# Patient Record
Sex: Male | Born: 1975
Health system: Southern US, Community
[De-identification: ages and names within clinical notes are randomized; demographics above are authoritative.]

## PROBLEM LIST (undated history)

## (undated) DIAGNOSIS — E878 Other disorders of electrolyte and fluid balance, not elsewhere classified: Secondary | ICD-10-CM

## (undated) DIAGNOSIS — Z86010 Personal history of colonic polyps: Secondary | ICD-10-CM

## (undated) DIAGNOSIS — N401 Enlarged prostate with lower urinary tract symptoms: Secondary | ICD-10-CM

## (undated) DIAGNOSIS — R740 Nonspecific elevation of levels of transaminase and lactic acid dehydrogenase [LDH]: Secondary | ICD-10-CM

## (undated) DIAGNOSIS — R7303 Prediabetes: Secondary | ICD-10-CM

## (undated) DIAGNOSIS — L0201 Cutaneous abscess of face: Secondary | ICD-10-CM

## (undated) DIAGNOSIS — J301 Allergic rhinitis due to pollen: Secondary | ICD-10-CM

## (undated) DIAGNOSIS — F411 Generalized anxiety disorder: Secondary | ICD-10-CM

## (undated) DIAGNOSIS — K760 Fatty (change of) liver, not elsewhere classified: Secondary | ICD-10-CM

## (undated) DIAGNOSIS — R030 Elevated blood-pressure reading, without diagnosis of hypertension: Secondary | ICD-10-CM

## (undated) DIAGNOSIS — N138 Other obstructive and reflux uropathy: Secondary | ICD-10-CM

## (undated) DIAGNOSIS — Z860101 Personal history of adenomatous and serrated colon polyps: Secondary | ICD-10-CM

## (undated) HISTORY — DX: Other disorders of electrolyte and fluid balance, not elsewhere classified: E87.8

## (undated) HISTORY — DX: Personal history of colonic polyps: Z86.010

## (undated) HISTORY — DX: Allergic rhinitis due to pollen: J30.1

## (undated) HISTORY — DX: Generalized anxiety disorder: F41.1

## (undated) HISTORY — DX: Cutaneous abscess of face: L02.01

## (undated) HISTORY — DX: Prediabetes: R73.03

## (undated) HISTORY — DX: Fatty (change of) liver, not elsewhere classified: K76.0

## (undated) HISTORY — DX: Nonspecific elevation of levels of transaminase and lactic acid dehydrogenase (ldh): R74.0

## (undated) HISTORY — DX: Personal history of adenomatous and serrated colon polyps: Z86.0101

## (undated) HISTORY — PX: COLONOSCOPY W/ POLYPECTOMY: SHX1380

## (undated) HISTORY — DX: Elevated blood-pressure reading, without diagnosis of hypertension: R03.0

## (undated) HISTORY — DX: Benign prostatic hyperplasia with lower urinary tract symptoms: N40.1

## (undated) HISTORY — DX: Other obstructive and reflux uropathy: N13.8

---

## 2016-02-16 DIAGNOSIS — N401 Enlarged prostate with lower urinary tract symptoms: Secondary | ICD-10-CM

## 2016-02-16 DIAGNOSIS — N138 Other obstructive and reflux uropathy: Secondary | ICD-10-CM

## 2016-02-16 HISTORY — DX: Benign prostatic hyperplasia with lower urinary tract symptoms: N40.1

## 2016-02-16 HISTORY — DX: Other obstructive and reflux uropathy: N13.8

## 2016-05-26 ENCOUNTER — Ambulatory Visit (INDEPENDENT_AMBULATORY_CARE_PROVIDER_SITE_OTHER): Payer: BLUE CROSS/BLUE SHIELD | Admitting: Family Medicine

## 2016-05-26 ENCOUNTER — Encounter: Payer: Self-pay | Admitting: Family Medicine

## 2016-05-26 VITALS — BP 127/81 | HR 76 | Temp 97.7°F | Resp 16 | Ht 71.0 in | Wt 221.8 lb

## 2016-05-26 DIAGNOSIS — N410 Acute prostatitis: Secondary | ICD-10-CM | POA: Diagnosis not present

## 2016-05-26 DIAGNOSIS — R350 Frequency of micturition: Secondary | ICD-10-CM | POA: Diagnosis not present

## 2016-05-26 LAB — POCT URINALYSIS DIPSTICK
Bilirubin, UA: NEGATIVE
GLUCOSE UA: NEGATIVE
KETONES UA: NEGATIVE
Leukocytes, UA: NEGATIVE
NITRITE UA: NEGATIVE
PH UA: 7 (ref 5.0–8.0)
Protein, UA: NEGATIVE
RBC UA: NEGATIVE
Spec Grav, UA: 1.02 (ref 1.010–1.025)
UROBILINOGEN UA: 0.2 U/dL

## 2016-05-26 MED ORDER — LEVOFLOXACIN 500 MG PO TABS: ORAL_TABLET | ORAL | 0 refills | Status: DC

## 2016-05-26 NOTE — Progress Notes (Signed)
Pre visit review using our clinic review tool, if applicable. No additional management support is needed unless otherwise documented below in the visit note. 

## 2016-05-26 NOTE — Progress Notes (Addendum)
Office Note 05/26/2016  CC:  Chief Complaint  Patient presents with  . Establish Care  . Urinary Frequency    x 1-2 months    HPI:  Joshua Page is a 41 y.o. male who is new to the practice but has not been here yet for a visit to officially establish care. I agreed to see him for an acute problem: urinary complaints.   Patient's most recent primary MD: Dr. Roosvelt Maser in [place]--pt moved to [place] 2 yrs ago. Old records were not reviewed prior to or during today's visit.  About the last 2-3 months he has had to urinate q 2-3 hours.  No sensation of incomplete emptying. Says urine stream is not as strong as it used to be.  Occ hurts in penis with urination.  No d/c but dribbling is noted "all the time".  Nocturia 1-2 times per night.  Doesn't limit fluids in evenings any.  Occ hesitancy before urine will come out.  No hx of signif retention.  No hx of STI.  He is monogamous with his wife.  No blood in urine. Denies irritation or pain in prostate region but occ has some pain in L groin area. No new habits that preceded onset of sx's.  TAkes allegra D prn.  No polydipsia.  Past Medical History:  Diagnosis Date  . Elevated blood pressure reading without diagnosis of hypertension   . Hay fever   . Prediabetes    via work labs 2017/18    History reviewed. No pertinent surgical history.  Family History  Problem Relation Age of Onset  . Mental illness Father   . Diabetes Father   . Alcohol abuse Father   . Diabetes Maternal Grandmother   . Pancreatic cancer Maternal Grandfather     Social History   Social History  . Marital status: Married    Spouse name: N/A  . Number of children: N/A  . Years of education: N/A   Occupational History  . Not on file.   Social History Main Topics  . Smoking status: Former Smoker    Packs/day: 0.25    Years: 15.00    Types: Cigarettes    Quit date: 02/15/2013  . Smokeless tobacco: Former Neurosurgeon    Types: Chew    Quit date: 02/15/2014   . Alcohol use No  . Drug use: No  . Sexual activity: Not on file   Other Topics Concern  . Not on file   Social History Narrative   Married, 5 children.     Educ: college Surveyor, quantity)   [employer]	[place] in past.   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   No tobacco as of 2016.  5 pack-yr hx prior.  Hx of dipping snuff as well.      MEDS: none  No Known Allergies  ROS Review of Systems  Constitutional: Negative for fatigue and fever.  HENT: Negative for congestion and sore throat.   Eyes: Negative for visual disturbance.  Respiratory: Negative for cough.   Cardiovascular: Negative for chest pain.  Gastrointestinal: Negative for abdominal pain and nausea.  Genitourinary: Negative for scrotal swelling and testicular pain.       See HPI  Musculoskeletal: Negative for back pain and joint swelling.  Skin: Negative for rash.  Neurological: Negative for weakness and headaches.  Hematological: Negative for adenopathy.    PE; Blood pressure 127/81, pulse 76, temperature 97.7 F (36.5 C), temperature source Oral, resp. rate 16, height  (  1.803 m), weight 221 lb 12 oz (100.6 kg), SpO2 96 %. Gen: Alert, well appearing.  Patient is oriented to person, place, time, and situation. AFFECT: pleasant, lucid thought and speech. ZOX:WRUE: no injection, icteris, swelling, or exudate.  EOMI, PERRLA. Mouth: lips without lesion/swelling.  Oral mucosa pink and moist. Oropharynx without erythema, exudate, or swelling.  CV: RRR, no m/r/g.   LUNGS: CTA bilat, nonlabored resps, good aeration in all lung fields. ABD: soft, NT/ND.  When suprapubic region is palpated deeply it makes pt want to urinate worse.  No tenderness. EXT: no clubbing, cyanosis, or edema.  Rectal exam: negative without mass, lesions or tenderness, PROSTATE EXAM: smooth and symmetric without nodules.  Prostate enlarged 2+, symmetrical..  Palpation of prostate shows generalized hypersensitivity and urge to  urinate, but no definite tenderness.    Pertinent labs:   CC UA today: entirely normal  ASSESSMENT AND PLAN:   Acute prostatitis suspected. Treat with levaquin 500 mg qd x 14d. Avoid caffeine. Sent urine for c/s for completeness.  An After Visit Summary was printed and given to the patient.  Return for 2-3 weeks for fasting CPE and f/u prostate.  Signed:  Santiago Bumpers, MD           05/26/2016

## 2016-05-27 LAB — URINE CULTURE: Organism ID, Bacteria: NO GROWTH

## 2016-05-28 ENCOUNTER — Ambulatory Visit: Payer: Self-pay | Admitting: Family Medicine

## 2016-06-11 ENCOUNTER — Other Ambulatory Visit: Payer: Self-pay | Admitting: *Deleted

## 2016-06-11 ENCOUNTER — Encounter: Payer: BLUE CROSS/BLUE SHIELD | Admitting: Family Medicine

## 2016-06-11 ENCOUNTER — Other Ambulatory Visit (INDEPENDENT_AMBULATORY_CARE_PROVIDER_SITE_OTHER): Payer: BLUE CROSS/BLUE SHIELD

## 2016-06-11 ENCOUNTER — Encounter: Payer: Self-pay | Admitting: *Deleted

## 2016-06-11 DIAGNOSIS — R7303 Prediabetes: Secondary | ICD-10-CM | POA: Diagnosis not present

## 2016-06-11 DIAGNOSIS — Z0000 Encounter for general adult medical examination without abnormal findings: Secondary | ICD-10-CM | POA: Diagnosis not present

## 2016-06-11 LAB — CBC WITH DIFFERENTIAL/PLATELET
Basophils Absolute: 0.1 10*3/uL (ref 0.0–0.1)
Basophils Relative: 1.2 % (ref 0.0–3.0)
EOS PCT: 2.7 % (ref 0.0–5.0)
Eosinophils Absolute: 0.2 10*3/uL (ref 0.0–0.7)
HCT: 47 % (ref 39.0–52.0)
Hemoglobin: 16 g/dL (ref 13.0–17.0)
LYMPHS ABS: 2.3 10*3/uL (ref 0.7–4.0)
Lymphocytes Relative: 37.7 % (ref 12.0–46.0)
MCHC: 34 g/dL (ref 30.0–36.0)
MCV: 88.9 fl (ref 78.0–100.0)
Monocytes Absolute: 0.5 10*3/uL (ref 0.1–1.0)
Monocytes Relative: 8.9 % (ref 3.0–12.0)
NEUTROS ABS: 3 10*3/uL (ref 1.4–7.7)
NEUTROS PCT: 49.5 % (ref 43.0–77.0)
PLATELETS: 264 10*3/uL (ref 150.0–400.0)
RBC: 5.29 Mil/uL (ref 4.22–5.81)
RDW: 12.9 % (ref 11.5–15.5)
WBC: 6.1 10*3/uL (ref 4.0–10.5)

## 2016-06-11 LAB — LIPID PANEL
CHOL/HDL RATIO: 4
Cholesterol: 143 mg/dL (ref 0–200)
HDL: 40.6 mg/dL (ref >39.00)
LDL Cholesterol: 81 mg/dL (ref 0–99)
NONHDL: 102.49
Triglycerides: 105 mg/dL (ref 0.0–149.0)
VLDL: 21 mg/dL (ref 0.0–40.0)

## 2016-06-11 LAB — BASIC METABOLIC PANEL
BUN: 12 mg/dL (ref 6–23)
CO2: 30 meq/L (ref 19–32)
Calcium: 9.5 mg/dL (ref 8.4–10.5)
Chloride: 104 mEq/L (ref 96–112)
Creatinine, Ser: 0.78 mg/dL (ref 0.40–1.50)
GFR: 117.58 mL/min (ref >60.00)
Glucose, Bld: 99 mg/dL (ref 70–99)
POTASSIUM: 4.2 meq/L (ref 3.5–5.1)
SODIUM: 141 meq/L (ref 135–145)

## 2016-06-11 LAB — TSH: TSH: 1.71 u[IU]/mL (ref 0.35–4.50)

## 2016-06-11 LAB — HEMOGLOBIN A1C: Hgb A1c MFr Bld: 6.2 % (ref 4.6–6.5)

## 2016-06-11 NOTE — Progress Notes (Deleted)
Office Note 06/11/2016  CC: No chief complaint on file.   HPI:  Joshua Page is a 41 y.o. male who is here for annual health maintenance exam and f/u urinary frequency, which I diagnosed as prostatitis at his visit 05/26/16. I rx'd him levaquin  x 14d at that time.   Past Medical History:  Diagnosis Date  . Elevated blood pressure reading without diagnosis of hypertension   . Hay fever   . Prediabetes    via work labs 2017/18    No past surgical history on file.  Family History  Problem Relation Age of Onset  . Mental illness Father   . Diabetes Father   . Alcohol abuse Father   . Diabetes Maternal Grandmother   . Pancreatic cancer Maternal Grandfather     Social History   Social History  . Marital status: Married    Spouse name: N/A  . Number of children: N/A  . Years of education: N/A   Occupational History  . Not on file.   Social History Main Topics  . Smoking status: Former Smoker    Packs/day: 0.25    Years: 15.00    Types: Cigarettes    Quit date: 02/15/2013  . Smokeless tobacco: Former Neurosurgeon    Types: Chew    Quit date: 02/15/2014  . Alcohol use No  . Drug use: No  . Sexual activity: Not on file   Other Topics Concern  . Not on file   Social History Narrative   Married, 5 children.     Educ: college Surveyor, quantity)   [employer]	[place] in past.   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   No tobacco as of 2016.  5 pack-yr hx prior.  Hx of dipping snuff as well.       Outpatient Medications Prior to Visit  Medication Sig Dispense Refill  . levofloxacin (LEVAQUIN) 500 MG tablet 1 tab po qd x 14 days 14 tablet 0   No facility-administered medications prior to visit.     No Known Allergies  ROS *** PE; There were no vitals taken for this visit. *** Pertinent labs:  none  ASSESSMENT AND PLAN:   No problem-specific Assessment & Plan notes found for this encounter.   FOLLOW UP:  No Follow-up on file.

## 2016-06-13 ENCOUNTER — Encounter: Payer: Self-pay | Admitting: Family Medicine

## 2016-06-25 ENCOUNTER — Ambulatory Visit (INDEPENDENT_AMBULATORY_CARE_PROVIDER_SITE_OTHER): Payer: BLUE CROSS/BLUE SHIELD | Admitting: Family Medicine

## 2016-06-25 ENCOUNTER — Encounter: Payer: Self-pay | Admitting: Family Medicine

## 2016-06-25 VITALS — BP 102/70 | HR 67 | Temp 98.1°F | Resp 16 | Ht 71.0 in | Wt 219.5 lb

## 2016-06-25 DIAGNOSIS — R7303 Prediabetes: Secondary | ICD-10-CM

## 2016-06-25 DIAGNOSIS — N401 Enlarged prostate with lower urinary tract symptoms: Secondary | ICD-10-CM

## 2016-06-25 DIAGNOSIS — Z0000 Encounter for general adult medical examination without abnormal findings: Secondary | ICD-10-CM | POA: Diagnosis not present

## 2016-06-25 NOTE — Addendum Note (Signed)
Addended by: Smitty KnudsenSUTHERLAND, Tarra Pence K on: 06/25/2016 02:50 PM   Modules accepted: Orders

## 2016-06-25 NOTE — Patient Instructions (Signed)
 Health Maintenance, Male A healthy lifestyle and preventive care is important for your health and wellness. Ask your health care provider about what schedule of regular examinations is right for you. What should I know about weight and diet?  Eat a Healthy Diet  Eat plenty of vegetables, fruits, whole grains, low-fat dairy products, and lean protein.  Do not eat a lot of foods high in solid fats, added sugars, or salt. Maintain a Healthy Weight  Regular exercise can help you achieve or maintain a healthy weight. You should:  Do at least 150 minutes of exercise each week. The exercise should increase your heart rate and make you sweat (moderate-intensity exercise).  Do strength-training exercises at least twice a week. Watch Your Levels of Cholesterol and Blood Lipids  Have your blood tested for lipids and cholesterol every 5 years starting at 39 years of age. If you are at high risk for heart disease, you should start having your blood tested when you are 39 years old. You may need to have your cholesterol levels checked more often if:  Your lipid or cholesterol levels are high.  You are older than 39 years of age.  You are at high risk for heart disease. What should I know about cancer screening? Many types of cancers can be detected early and may often be prevented. Lung Cancer  You should be screened every year for lung cancer if:  You are a current smoker who has smoked for at least 30 years.  You are a former smoker who has quit within the past 15 years.  Talk to your health care provider about your screening options, when you should start screening, and how often you should be screened. Colorectal Cancer  Routine colorectal cancer screening usually begins at 39 years of age and should be repeated every 5-10 years until you are 39 years old. You may need to be screened more often if early forms of precancerous polyps or small growths are found. Your health care provider  may recommend screening at an earlier age if you have risk factors for colon cancer.  Your health care provider may recommend using home test kits to check for hidden blood in the stool.  A small camera at the end of a tube can be used to examine your colon (sigmoidoscopy or colonoscopy). This checks for the earliest forms of colorectal cancer. Prostate and Testicular Cancer  Depending on your age and overall health, your health care provider may do certain tests to screen for prostate and testicular cancer.  Talk to your health care provider about any symptoms or concerns you have about testicular or prostate cancer. Skin Cancer  Check your skin from head to toe regularly.  Tell your health care provider about any new moles or changes in moles, especially if:  There is a change in a mole's size, shape, or color.  You have a mole that is larger than a pencil eraser.  Always use sunscreen. Apply sunscreen liberally and repeat throughout the day.  Protect yourself by wearing long sleeves, pants, a wide-brimmed hat, and sunglasses when outside. What should I know about heart disease, diabetes, and high blood pressure?  If you are 18-39 years of age, have your blood pressure checked every 3-5 years. If you are 40 years of age or older, have your blood pressure checked every year. You should have your blood pressure measured twice-once when you are at a hospital or clinic, and once when you are not at   a hospital or clinic. Record the average of the two measurements. To check your blood pressure when you are not at a hospital or clinic, you can use:  An automated blood pressure machine at a pharmacy.  A home blood pressure monitor.  Talk to your health care provider about your target blood pressure.  If you are between 45-79 years old, ask your health care provider if you should take aspirin to prevent heart disease.  Have regular diabetes screenings by checking your fasting blood sugar  level.  If you are at a normal weight and have a low risk for diabetes, have this test once every three years after the age of 45.  If you are overweight and have a high risk for diabetes, consider being tested at a younger age or more often.  A one-time screening for abdominal aortic aneurysm (AAA) by ultrasound is recommended for men aged 65-75 years who are current or former smokers. What should I know about preventing infection? Hepatitis B  If you have a higher risk for hepatitis B, you should be screened for this virus. Talk with your health care provider to find out if you are at risk for hepatitis B infection. Hepatitis C  Blood testing is recommended for:  Everyone born from 1945 through 1965.  Anyone with known risk factors for hepatitis C. Sexually Transmitted Diseases (STDs)  You should be screened each year for STDs including gonorrhea and chlamydia if:  You are sexually active and are younger than 39 years of age.  You are older than 39 years of age and your health care provider tells you that you are at risk for this type of infection.  Your sexual activity has changed since you were last screened and you are at an increased risk for chlamydia or gonorrhea. Ask your health care provider if you are at risk.  Talk with your health care provider about whether you are at high risk of being infected with HIV. Your health care provider may recommend a prescription medicine to help prevent HIV infection. What else can I do?  Schedule regular health, dental, and eye exams.  Stay current with your vaccines (immunizations).  Do not use any tobacco products, such as cigarettes, chewing tobacco, and e-cigarettes. If you need help quitting, ask your health care provider.  Limit alcohol intake to no more than 2 drinks per day. One drink equals 12 ounces of beer, 5 ounces of wine, or 1 ounces of hard liquor.  Do not use street drugs.  Do not share needles.  Ask your health  care provider for help if you need support or information about quitting drugs.  Tell your health care provider if you often feel depressed.  Tell your health care provider if you have ever been abused or do not feel safe at home. This information is not intended to replace advice given to you by your health care provider. Make sure you discuss any questions you have with your health care provider. Document Released: 07/31/2007 Document Revised: 10/01/2015 Document Reviewed: 11/05/2014 Elsevier Interactive Patient Education  2017 Elsevier Inc.  

## 2016-06-25 NOTE — Progress Notes (Signed)
Office Note 06/25/2016  CC:  Chief Complaint  Patient presents with  . Annual Exam    Labs done on 06/11/16    HPI:  Joshua Page is a 41 y.o. male who is here for annual health maintenance exam. He had fasting lab panel 06/11/16 + HbA1c and we have reviewed these in detail.\  Has had improvement in urinary sx's since last visit when I treated him with abx for presumed chronic prostatitis. Still with some residual/milder sense of incomplete emptying, urinary frequency with poor stream, some excessive dribbling at the end of urination.  No dysuria, no minimal nocturia as long as he limits evening fluids.  Eyes: needs eye exam.  No new complaints today. Past Medical History:  Diagnosis Date  . Elevated blood pressure reading without diagnosis of hypertension   . Hay fever   . Prediabetes    via work labs 2017/18; A1c 6.2% here 05/2016.    History reviewed. No pertinent surgical history.  Family History  Problem Relation Age of Onset  . Mental illness Father   . Diabetes Father   . Alcohol abuse Father   . Diabetes Maternal Grandmother   . Pancreatic cancer Maternal Grandfather     Social History   Social History  . Marital status: Married    Spouse name: N/A  . Number of children: N/A  . Years of education: N/A   Occupational History  . Not on file.   Social History Main Topics  . Smoking status: Former Smoker    Packs/day: 0.25    Years: 15.00    Types: Cigarettes    Quit date: 02/15/2013  . Smokeless tobacco: Former NeurosurgeonUser    Types: Chew    Quit date: 02/15/2014  . Alcohol use No  . Drug use: No  . Sexual activity: Not on file   Other Topics Concern  . Not on file   Social History Narrative   Married, 5 children.     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   No tobacco as of 2016.  5 pack-yr hx prior.  Hx of dipping snuff as well.       Outpatient Medications Prior to Visit   Medication Sig Dispense Refill  . levofloxacin (LEVAQUIN) 500 MG tablet 1 tab po qd x 14 days (Patient not taking: Reported on 06/25/2016) 14 tablet 0   No facility-administered medications prior to visit.     No Known Allergies  ROS Review of Systems  Constitutional: Negative for appetite change, chills, fatigue and fever.  HENT: Negative for congestion, dental problem, ear pain and sore throat.   Eyes: Negative for discharge, redness and visual disturbance.  Respiratory: Negative for cough, chest tightness, shortness of breath and wheezing.   Cardiovascular: Negative for chest pain, palpitations and leg swelling.  Gastrointestinal: Negative for abdominal pain, blood in stool, diarrhea, nausea and vomiting.  Genitourinary: Positive for difficulty urinating and frequency. Negative for dysuria, flank pain, hematuria and urgency.  Musculoskeletal: Negative for arthralgias, back pain, joint swelling, myalgias and neck stiffness.  Skin: Negative for pallor and rash.  Neurological: Negative for dizziness, speech difficulty, weakness and headaches.  Hematological: Negative for adenopathy. Does not bruise/bleed easily.  Psychiatric/Behavioral: Negative for confusion and sleep disturbance. The patient is not nervous/anxious.     PE; Blood pressure 102/70, pulse 67, temperature 98.1 F (36.7 C), temperature source Oral, resp. rate 16, height 5\' 11"  (1.803 m),  weight 219 lb 8 oz (99.6 kg), SpO2 97 %. Gen: Alert, well appearing.  Patient is oriented to person, place, time, and situation. AFFECT: pleasant, lucid thought and speech. ENT: Ears: EACs clear, normal epithelium.  TMs with good light reflex and landmarks bilaterally.  Eyes: no injection, icteris, swelling, or exudate.  EOMI, PERRLA. Nose: no drainage or turbinate edema/swelling.  No injection or focal lesion.  Mouth: lips without lesion/swelling.  Oral mucosa pink and moist.  Dentition intact and without obvious caries or gingival  swelling.  Oropharynx without erythema, exudate, or swelling.  Neck: supple/nontender.  No LAD, mass, or TM.  Carotid pulses 2+ bilaterally, without bruits. CV: RRR, no m/r/g.   LUNGS: CTA bilat, nonlabored resps, good aeration in all lung fields. ABD: soft, NT, ND, BS normal.  No hepatospenomegaly or mass.  No bruits. EXT: no clubbing, cyanosis, or edema.  Musculoskeletal: no joint swelling, erythema, warmth, or tenderness.  ROM of all joints intact. Skin - no sores or suspicious lesions or rashes or color changes Rectal deferred.  Pertinent labs:  Lab Results  Component Value Date   TSH 1.71 06/11/2016   Lab Results  Component Value Date   WBC 6.1 06/11/2016   HGB 16.0 06/11/2016   HCT 47.0 06/11/2016   MCV 88.9 06/11/2016   PLT 264.0 06/11/2016   Lab Results  Component Value Date   CREATININE 0.78 06/11/2016   BUN 12 06/11/2016   NA 141 06/11/2016   K 4.2 06/11/2016   CL 104 06/11/2016   CO2 30 06/11/2016    Lab Results  Component Value Date   CHOL 143 06/11/2016   Lab Results  Component Value Date   HDL 40.60 06/11/2016   Lab Results  Component Value Date   LDLCALC 81 06/11/2016   Lab Results  Component Value Date   TRIG 105.0 06/11/2016   Lab Results  Component Value Date   CHOLHDL 4 06/11/2016   Lab Results  Component Value Date   HGBA1C 6.2 06/11/2016   ASSESSMENT AND PLAN:   Health maintenance exam: Reviewed age and gender appropriate health maintenance issues (prudent diet, regular exercise, health risks of tobacco and excessive alcohol, use of seatbelts, fire alarms in home, use of sunscreen).  Also reviewed age and gender appropriate health screening as well as vaccine recommendations. HP labs all good except glucose 126 as noted above. Hb A1c prediabetic range.  He needs to set up lab visit for repeat fasting glucose. TLC discussed, strongly encouraged, and pt is on board with this plan. He deferred Td today b/c he wants me to check records  of prior PCP when they come in.  Regarding his mild BPH sx's: he defers trial of medication at this time. Signs/symptoms to call or return for were reviewed and pt expressed understanding.  An After Visit Summary was printed and given to the patient.  FOLLOW UP:  Return in about 3 months (around 09/25/2016) for routine chronic illness f/u (also, needs lab appt for fasting lab at his earliest convenience).  Signed:  Santiago Bumpers, MD           06/25/2016

## 2016-07-06 ENCOUNTER — Other Ambulatory Visit (INDEPENDENT_AMBULATORY_CARE_PROVIDER_SITE_OTHER): Payer: BLUE CROSS/BLUE SHIELD

## 2016-07-06 DIAGNOSIS — R7303 Prediabetes: Secondary | ICD-10-CM | POA: Diagnosis not present

## 2016-07-06 LAB — GLUCOSE, RANDOM: GLUCOSE: 115 mg/dL — AB (ref 70–99)

## 2016-07-13 DIAGNOSIS — J029 Acute pharyngitis, unspecified: Secondary | ICD-10-CM | POA: Diagnosis not present

## 2016-07-13 DIAGNOSIS — I4510 Unspecified right bundle-branch block: Secondary | ICD-10-CM | POA: Diagnosis not present

## 2016-07-22 ENCOUNTER — Emergency Department (HOSPITAL_BASED_OUTPATIENT_CLINIC_OR_DEPARTMENT_OTHER): Payer: BLUE CROSS/BLUE SHIELD

## 2016-07-22 ENCOUNTER — Encounter (HOSPITAL_BASED_OUTPATIENT_CLINIC_OR_DEPARTMENT_OTHER): Payer: Self-pay | Admitting: *Deleted

## 2016-07-22 ENCOUNTER — Emergency Department (HOSPITAL_BASED_OUTPATIENT_CLINIC_OR_DEPARTMENT_OTHER)
Admission: EM | Admit: 2016-07-22 | Discharge: 2016-07-22 | Disposition: A | Discharge status: Home / Self Care | Payer: BLUE CROSS/BLUE SHIELD | Attending: Emergency Medicine | Admitting: Emergency Medicine

## 2016-07-22 DIAGNOSIS — R072 Precordial pain: Secondary | ICD-10-CM | POA: Insufficient documentation

## 2016-07-22 DIAGNOSIS — Z79899 Other long term (current) drug therapy: Secondary | ICD-10-CM | POA: Diagnosis not present

## 2016-07-22 DIAGNOSIS — Z87891 Personal history of nicotine dependence: Secondary | ICD-10-CM | POA: Diagnosis not present

## 2016-07-22 DIAGNOSIS — R002 Palpitations: Secondary | ICD-10-CM | POA: Insufficient documentation

## 2016-07-22 DIAGNOSIS — R05 Cough: Secondary | ICD-10-CM | POA: Diagnosis not present

## 2016-07-22 DIAGNOSIS — R079 Chest pain, unspecified: Secondary | ICD-10-CM | POA: Diagnosis not present

## 2016-07-22 LAB — BASIC METABOLIC PANEL
ANION GAP: 8 (ref 5–15)
BUN: 13 mg/dL (ref 6–20)
CALCIUM: 9.3 mg/dL (ref 8.9–10.3)
CO2: 29 mmol/L (ref 22–32)
Chloride: 100 mmol/L — ABNORMAL LOW (ref 101–111)
Creatinine, Ser: 0.76 mg/dL (ref 0.61–1.24)
GLUCOSE: 167 mg/dL — AB (ref 65–99)
POTASSIUM: 3.8 mmol/L (ref 3.5–5.1)
Sodium: 137 mmol/L (ref 135–145)

## 2016-07-22 LAB — TROPONIN I: Troponin I: <0.03 ng/mL (ref <0.03)

## 2016-07-22 LAB — D-DIMER, QUANTITATIVE (NOT AT ARMC)

## 2016-07-22 LAB — CBC
HEMATOCRIT: 47.7 % (ref 39.0–52.0)
HEMOGLOBIN: 16.2 g/dL (ref 13.0–17.0)
MCH: 30.2 pg (ref 26.0–34.0)
MCHC: 34 g/dL (ref 30.0–36.0)
MCV: 88.8 fL (ref 78.0–100.0)
Platelets: 251 10*3/uL (ref 150–400)
RBC: 5.37 MIL/uL (ref 4.22–5.81)
RDW: 12.8 % (ref 11.5–15.5)
WBC: 6.8 10*3/uL (ref 4.0–10.5)

## 2016-07-22 MED ORDER — ASPIRIN 81 MG PO CHEW: 81.0000 mg | CHEWABLE_TABLET | Freq: Every day | ORAL | 1 refills | Status: AC

## 2016-07-22 NOTE — ED Notes (Signed)
ED Provider at bedside. 

## 2016-07-22 NOTE — ED Provider Notes (Signed)
[facility] Provider Note   CSN: [id number] Arrival date & time: 07/22/16  1505     History   Chief Complaint Chief Complaint  Patient presents with  . Chest Pain  . Numbness    HPI Joshua Page is a 41 y.o. male.  Patient at about 230s after noon with substernal chest discomfort radiating to the right arm and neck. Also with feeling of palpitations and is at the heart was beating fast. Patient took aspirin at home prior to coming in. Patient feels fine now. Patient did have some right arm pain on and off over the past few weeks. Associated with some mild shortness of breath.      Past Medical History:  Diagnosis Date  . BPH with obstruction/lower urinary tract symptoms 2018   Pt declined bph med as of 06/2016  . Elevated blood pressure reading without diagnosis of hypertension   . Hay fever   . Prediabetes    via work labs 2017/18; A1c 6.2% here 05/2016.    Patient Active Problem List   Diagnosis Date Noted  . Prediabetes     History reviewed. No pertinent surgical history.     Home Medications    Prior to Admission medications   Medication Sig Start Date End Date Taking? Authorizing Provider  ALBUTEROL IN Inhale into the lungs.   Yes [provider]  aspirin 81 MG chewable tablet Chew 1 tablet (81 mg total) by mouth daily. 07/22/16   Vanetta MuldersZackowski, Akaysha Cobern, MD    Family History Family History  Problem Relation Age of Onset  . Mental illness Father   . Diabetes Father   . Alcohol abuse Father   . Diabetes Maternal Grandmother   . Pancreatic cancer Maternal Grandfather     Social History Social History  Substance Use Topics  . Smoking status: Former Smoker    Packs/day: 0.25    Years: 15.00    Types: Cigarettes    Quit date: 02/15/2013  . Smokeless tobacco: Former NeurosurgeonUser    Types: Chew    Quit date: 02/15/2014  . Alcohol use No     Allergies   Patient has no known allergies.   Review of Systems Review of Systems  Constitutional:  Negative for fever.  HENT: Negative for congestion.   Eyes: Negative for visual disturbance.  Respiratory: Positive for shortness of breath.   Cardiovascular: Positive for chest pain and palpitations.  Gastrointestinal: Negative for abdominal pain, nausea and vomiting.  Genitourinary: Negative for dysuria.  Musculoskeletal: Positive for neck pain.  Neurological: Negative for syncope and headaches.  Hematological: Does not bruise/bleed easily.  Psychiatric/Behavioral: Negative for confusion.     Physical Exam Updated Vital Signs BP 128/87   Pulse 67   Temp 97.9 F (36.6 C) (Oral)   Resp 12   Ht 1.829 m (6')   Wt 90.7 kg (200 lb)   SpO2 98%   BMI 27.12 kg/m   Physical Exam  Constitutional: He is oriented to person, place, and time. He appears well-developed and well-nourished. No distress.  HENT:  Head: Normocephalic and atraumatic.  Mouth/Throat: Oropharynx is clear and moist.  Eyes: EOM are normal. Pupils are equal, round, and reactive to light.  Neck: Normal range of motion. Neck supple.  Cardiovascular: Normal rate, regular rhythm and normal heart sounds.   Pulmonary/Chest: Effort normal and breath sounds normal. No respiratory distress.  Abdominal: Soft. Bowel sounds are normal. There is no tenderness.  Musculoskeletal: Normal range of motion.  Neurological: He is  alert and oriented to person, place, and time. No cranial nerve deficit or sensory deficit. He exhibits normal muscle tone. Coordination normal.  Skin: Skin is warm.  Nursing note and vitals reviewed.    ED Treatments / Results  Labs (all labs ordered are listed, but only abnormal results are displayed) Labs Reviewed  BASIC METABOLIC PANEL - Abnormal; Notable for the following:       Result Value   Chloride 100 (*)    Glucose, Bld 167 (*)    All other components within normal limits  CBC  TROPONIN I  D-DIMER, QUANTITATIVE (NOT AT [facility])  TROPONIN I    EKG  EKG  Interpretation  Date/Time:  Thursday July 22 2016 15:10:47 EDT Ventricular Rate:  74 PR Interval:  144 QRS Duration: 104 QT Interval:  384 QTC Calculation: 426 R Axis:   -26 Text Interpretation:  Normal sinus rhythm with sinus arrhythmia Possible Left atrial enlargement Borderline ECG No previous ECGs available Confirmed by Vanetta Mulders [phone]) on 07/22/2016 3:14:25 PM       Radiology Dg Chest 2 View  Result Date: 07/22/2016 CLINICAL DATA:  C/o mid chest pain radiating down Rt arm/leg w/SOB today. Also states dry cough x 2 days EXAM: CHEST  2 VIEW COMPARISON:  None FINDINGS: Normal mediastinum and cardiac silhouette. Normal pulmonary vasculature. No evidence of effusion, infiltrate, or pneumothorax. No acute bony abnormality. IMPRESSION: No acute cardiopulmonary process. Electronically Signed   By: Genevive Bi M.D.   On: 07/22/2016 15:42    Procedures Procedures (including critical care time)  Medications Ordered in ED Medications - No data to display   Initial Impression / Assessment and Plan / ED Course  I have reviewed the triage vital signs and the nursing notes.  Pertinent labs & imaging results that were available during my care of the patient were reviewed by me and considered in my medical decision making (see chart for details).     Workup for the palpitations in the chest pain without acute findings. Troponins negative 2. Patient now completely asymptomatic. No evidence of any arrhythmia. Patient will require follow-up with cardiology. Referral information provided. Patient be started on baby aspirin a day. Also d-dimer was negative no concerns for pulmonary embolus.  Final Clinical Impressions(s) / ED Diagnoses   Final diagnoses:  Precordial pain  Palpitations    New Prescriptions New Prescriptions   ASPIRIN 81 MG CHEWABLE TABLET    Chew 1 tablet (81 mg total) by mouth daily.     Vanetta Mulders, MD 07/22/16 1945

## 2016-07-22 NOTE — Discharge Instructions (Signed)
Her taking baby aspirin a day. Make appointment follow cardiology. Return for new or worse symptoms. Today's workup without any specific findings. Work note provided.

## 2016-07-22 NOTE — ED Notes (Signed)
Patient stated that he has been having shortness of breath periodically for 2 months.

## 2016-07-22 NOTE — ED Triage Notes (Signed)
Pt reports mid-sternal chest pain that radiated down R side that began around 1445. Reports associated nausea, sweating, light-headedness. Symmetrical face; grips equal.

## 2016-09-01 ENCOUNTER — Encounter: Payer: Self-pay | Admitting: Family Medicine

## 2016-09-01 ENCOUNTER — Ambulatory Visit (INDEPENDENT_AMBULATORY_CARE_PROVIDER_SITE_OTHER): Payer: BLUE CROSS/BLUE SHIELD | Admitting: Family Medicine

## 2016-09-01 VITALS — BP 126/78 | HR 66 | Temp 98.0°F | Resp 16 | Ht 71.0 in | Wt 220.2 lb

## 2016-09-01 DIAGNOSIS — R7303 Prediabetes: Secondary | ICD-10-CM

## 2016-09-01 DIAGNOSIS — R002 Palpitations: Secondary | ICD-10-CM | POA: Diagnosis not present

## 2016-09-01 DIAGNOSIS — F4322 Adjustment disorder with anxiety: Secondary | ICD-10-CM

## 2016-09-01 DIAGNOSIS — H538 Other visual disturbances: Secondary | ICD-10-CM

## 2016-09-01 DIAGNOSIS — R358 Other polyuria: Secondary | ICD-10-CM

## 2016-09-01 DIAGNOSIS — R3589 Other polyuria: Secondary | ICD-10-CM

## 2016-09-01 DIAGNOSIS — E878 Other disorders of electrolyte and fluid balance, not elsewhere classified: Secondary | ICD-10-CM | POA: Diagnosis not present

## 2016-09-01 DIAGNOSIS — F410 Panic disorder [episodic paroxysmal anxiety] without agoraphobia: Secondary | ICD-10-CM

## 2016-09-01 LAB — POCT GLYCOSYLATED HEMOGLOBIN (HGB A1C): Hemoglobin A1C: 5.7

## 2016-09-01 LAB — POCT URINALYSIS DIPSTICK
BILIRUBIN UA: NEGATIVE
GLUCOSE UA: NEGATIVE
Ketones, UA: NEGATIVE
Leukocytes, UA: NEGATIVE
Nitrite, UA: NEGATIVE
Protein, UA: NEGATIVE
SPEC GRAV UA: 1.015 (ref 1.010–1.025)
Urobilinogen, UA: 0.2 E.U./dL
pH, UA: 6.5 (ref 5.0–8.0)

## 2016-09-01 LAB — GLUCOSE, POCT (MANUAL RESULT ENTRY): POC Glucose: 92 mg/dl (ref 70–99)

## 2016-09-01 MED ORDER — LORAZEPAM 0.5 MG PO TABS: ORAL_TABLET | ORAL | 1 refills | Status: DC

## 2016-09-01 NOTE — Progress Notes (Signed)
OFFICE VISIT  09/01/2016   CC:  Chief Complaint  Patient presents with  . Dizziness    per pt x 1 week, wife says it has been going on since June   HPI:    Patient is a 41 y.o.  male who presents with his wife for dizziness. Pt describes his equilibrium being off.  Seems to be more prevalent walking compared to sitting, worse in latter part of day.    Went to ED early June for CP with R arm pain, heart racing, SOB, dizziness.  CXR, EKG, Troponins, d-dimer all normal.  Reviewed the ED records in detail today. A few days ago he had same sx's but much less intense--sx's lasted about an hour. "Feels like anxiety attack but the dizziness doesn't go away". Has poor eating habits, travels a lot, FH of cancer and DM.  Stressful job. He had drank a red bull the morning of his first "attack when he went to ED. He had a starbucks the morning recently when he had his milder attack.  Admits to a lot of stress: 5 kids, stressful job, GM just died, one child with special needs. No exercise.  Denies depression. He is on edge/irritable, type A personality, admits he is a worrier--he says mainly b/c of his wife being a Product/process development scientistworrier. Concentration is off.  Energy level --he can't answer this clearly.   He does urinate every 40 minute.    Urine stream poor sometimes/hesitancy, dribbles a lot, sometimes incomplete emptying.  Nocturia 1-2 per night. +Excessive thirst, doesn't drink clear fluids much.    ROS: pretty frequent HAs, back or front.  Has blurry vision at times.  No hearing impairment but some ringing in ears during his more intense episodes.   Past Medical History:  Diagnosis Date  . BPH with obstruction/lower urinary tract symptoms 2018   Pt declined bph med as of 06/2016  . Elevated blood pressure reading without diagnosis of hypertension   . Hay fever   . Prediabetes    via work labs 2017/18; A1c 6.2% here 05/2016.    History reviewed. No pertinent surgical history.  MEDS: ASA 81mg   qd  No Known Allergies  ROS As per HPI  PE: Blood pressure 126/78, pulse 66, temperature 98 F (36.7 C), temperature source Oral, resp. rate 16, height 5\' 11"  (1.803 m), weight 220 lb 4 oz (99.9 kg), SpO2 96 %. Gen: Alert, well appearing.  Patient is oriented to person, place, time, and situation. AFFECT: pleasant, lucid thought and speech. ENT: Ears: EACs clear, normal epithelium.  TMs with good light reflex and landmarks bilaterally.  Eyes: no injection, icteris, swelling, or exudate.  EOMI, PERRLA. Nose: no drainage or turbinate edema/swelling.  No injection or focal lesion.  Mouth: lips without lesion/swelling.  Oral mucosa pink and moist.  Dentition intact and without obvious caries or gingival swelling.  Oropharynx without erythema, exudate, or swelling.  Neck: supple/nontender.  No LAD, mass, or TM.  Carotid pulses 2+ bilaterally, without bruits. CV: RRR, no m/r/g.   LUNGS: CTA bilat, nonlabored resps, good aeration in all lung fields. ABD: soft, NT/ND EXT: no clubbing, cyanosis, or edema.  Neuro: CN 2-12 intact bilaterally, strength 5/5 in proximal and distal upper extremities and lower extremities bilaterally.  No tremor.  No disdiadochokinesis.  No ataxia.  Upper extremity and lower extremity DTRs symmetric.  No pronator drift.  LABS:    Chemistry      Component Value Date/Time   NA 137 07/22/2016 1517  K 3.8 07/22/2016 1517   CL 100 (L) 07/22/2016 1517   CO2 29 07/22/2016 1517   BUN 13 07/22/2016 1517   CREATININE 0.76 07/22/2016 1517      Component Value Date/Time   CALCIUM 9.3 07/22/2016 1517     Lab Results  Component Value Date   HGBA1C 5.7 09/01/2016   Lab Results  Component Value Date   WBC 6.8 07/22/2016   HGB 16.2 07/22/2016   HCT 47.7 07/22/2016   MCV 88.8 07/22/2016   PLT 251 07/22/2016   Lab Results  Component Value Date   TSH 1.71 06/11/2016   POC glucose today: 92  POC HbA1c today: 5.7%  CC UA today: normal  IMPRESSION AND  PLAN:  Disequilibrium syndrome: suspect psychogenic, related to adjustment d/o with excessive stress/anxiety. However, with palpitations assoc with dizziness at times, will have him get 48h Holter. He definitely needs to adjust lifestyle to try to diminish stress, and come up with better coping skills for stress. He needs to decrease caffeine intake, if not avoid it altogether. Would continue ASA 81mg  qd. If holter unremarkable, next step is either neuro referral or MRI brain (disequilibrium, intermittent blurry vision, intermittent tinnitus). Discussed medication for anxiety today and pt is against use of antidepressants for long term tx of anxiety. He was agreeable to trial of ativan prn--but just wanted a small quantity and low dose to try, esp when flying. Rx'd lorazepam 0.5mg , 1-2 bid prn anxiety/stress.  Spent 40 min with pt today, with >50% of this time spent in counseling and care coordination regarding the above problems.  An After Visit Summary was printed and given to the patient.  FOLLOW UP: Return for to be determined based on results of Holter.  Signed:  Santiago Bumpers, MD           09/01/2016

## 2016-09-10 DIAGNOSIS — R7401 Elevation of levels of liver transaminase levels: Secondary | ICD-10-CM

## 2016-09-10 HISTORY — DX: Elevation of levels of liver transaminase levels: R74.01

## 2016-09-10 LAB — BASIC METABOLIC PANEL
BUN: 13 (ref 4–21)
CREATININE: 0.9 (ref <=1.3)
GLUCOSE: 114
POTASSIUM: 4.2 (ref 3.4–5.3)
Sodium: 141 (ref 137–147)

## 2016-09-10 LAB — HEPATIC FUNCTION PANEL
ALK PHOS: 52 (ref 25–125)
ALT: 76 — AB (ref 10–40)
AST: 41 — AB (ref 14–40)
BILIRUBIN DIRECT: 0.15
Bilirubin, Total: 0.7

## 2016-09-10 LAB — CBC AND DIFFERENTIAL
HCT: 46 (ref 41–53)
HEMOGLOBIN: 15.7 (ref 13.5–17.5)
Platelets: 240 (ref 150–399)
WBC: 5.4

## 2016-09-10 LAB — HEMOGLOBIN A1C
Hemoglobin A1C: 6.1
Hemoglobin A1C: 6.1

## 2016-09-10 LAB — LIPID PANEL
CHOLESTEROL: 142 (ref 0–200)
HDL: 43 (ref 35–70)
LDL CALC: 80
Triglycerides: 93 (ref 40–160)

## 2016-09-14 ENCOUNTER — Encounter: Payer: Self-pay | Admitting: Family Medicine

## 2016-09-15 HISTORY — PX: OTHER SURGICAL HISTORY: SHX169

## 2016-09-21 ENCOUNTER — Encounter: Payer: Self-pay | Admitting: Family Medicine

## 2016-09-21 ENCOUNTER — Ambulatory Visit (INDEPENDENT_AMBULATORY_CARE_PROVIDER_SITE_OTHER): Payer: BLUE CROSS/BLUE SHIELD

## 2016-09-21 DIAGNOSIS — R002 Palpitations: Secondary | ICD-10-CM

## 2016-09-21 DIAGNOSIS — E878 Other disorders of electrolyte and fluid balance, not elsewhere classified: Secondary | ICD-10-CM | POA: Diagnosis not present

## 2016-09-24 ENCOUNTER — Ambulatory Visit: Payer: BLUE CROSS/BLUE SHIELD | Admitting: Family Medicine

## 2016-09-27 ENCOUNTER — Encounter: Payer: Self-pay | Admitting: Family Medicine

## 2016-09-27 ENCOUNTER — Ambulatory Visit (INDEPENDENT_AMBULATORY_CARE_PROVIDER_SITE_OTHER): Payer: BLUE CROSS/BLUE SHIELD | Admitting: Family Medicine

## 2016-09-27 VITALS — BP 116/67 | HR 66 | Temp 98.2°F | Resp 16 | Ht 71.0 in | Wt 221.2 lb

## 2016-09-27 DIAGNOSIS — F410 Panic disorder [episodic paroxysmal anxiety] without agoraphobia: Secondary | ICD-10-CM | POA: Diagnosis not present

## 2016-09-27 DIAGNOSIS — S4352XA Sprain of left acromioclavicular joint, initial encounter: Secondary | ICD-10-CM

## 2016-09-27 DIAGNOSIS — R7401 Elevation of levels of liver transaminase levels: Secondary | ICD-10-CM

## 2016-09-27 DIAGNOSIS — R740 Nonspecific elevation of levels of transaminase and lactic acid dehydrogenase [LDH]: Secondary | ICD-10-CM | POA: Diagnosis not present

## 2016-09-27 DIAGNOSIS — R42 Dizziness and giddiness: Secondary | ICD-10-CM

## 2016-09-27 DIAGNOSIS — R7303 Prediabetes: Secondary | ICD-10-CM | POA: Diagnosis not present

## 2016-09-27 MED ORDER — LORAZEPAM 0.5 MG PO TABS: ORAL_TABLET | ORAL | 1 refills | Status: DC

## 2016-09-27 NOTE — Progress Notes (Signed)
OFFICE VISIT  09/27/2016   CC:  Chief Complaint  Patient presents with  . Follow-up    [facility]   HPI:    Patient is a 41 y.o. Caucasian male who presents for 3 mo f/u prediabetes, BPH with lower urinary tract obstructive sx's, and hx of mildly elevated transaminases. He was having some dizziness/disequilibrium episodes with palpitations and I ordered a holter monitor for him at last visit.  He did this and it has not been resulted yet. He is improved.  He has felt a couple episodes of mild disequilibrium, seems to be more associated with being in bigger crowds, also once on an airplane.  No palpitations anymore, but has had. occ dry mouth and trouble swallowing and felt decreased saliva with these episodes.  Some tinnitus but this is very intermittent and has been present since he had some excessive noise exposure in the [employer].  No headaches or wobbly walking. No CP with exertion but one episode since I last saw him of sharp, atypical CP at rest that went away when he calmed himself down.   He had to take ativan 0.5mg  on 3 occasions and this helped.  No adverse side effects.  Separate problem: recent onset (about 3 wks ago) of left shoulder pain when working out, hurts to sleep on and to aBduct over his head and reach behind him.  Has taken ibuprofen some.  No arm weakness.  No neck pain, no radiation of pain down arm, no paresthesias.   Past Medical History:  Diagnosis Date  . Adjustment disorder with anxiety   . BPH with obstruction/lower urinary tract symptoms 2018   Pt declined bph med as of 06/2016  . Disequilibrium syndrome   . Elevated blood pressure reading without diagnosis of hypertension   . Elevated transaminase level 09/10/2016   [facility] wellness labs--AST 41, ALT 76.  Marland Kitchen. Hay fever   . Prediabetes    via work labs 2017/18; A1c 6.2% here 05/2016.  Improved to 5.7% 08/2016.  Then, A1c via [facility] on 09/10/16 was 6.1%.    History reviewed. No pertinent surgical  history.  Outpatient Medications Prior to Visit  Medication Sig Dispense Refill  . ALBUTEROL IN Inhale into the lungs.    Marland Kitchen. aspirin 81 MG chewable tablet Chew 1 tablet (81 mg total) by mouth daily. 30 tablet 1  . LORazepam (ATIVAN) 0.5 MG tablet 1-2 tabs po bid prn anxiety/stress 6 tablet 1   No facility-administered medications prior to visit.     No Known Allergies  ROS As per HPI  PE: Blood pressure 116/67, pulse 66, temperature 98.2 F (36.8 C), temperature source Oral, resp. rate 16, height 5\' 11"  (1.803 m), weight 221 lb 4 oz (100.4 kg), SpO2 96 %. Gen: Alert, well appearing.  Patient is oriented to person, place, time, and situation. AFFECT: pleasant, lucid thought and speech. Left shoulder: minimal [facility], esp when patient is doing aBduction and scarf sign. +scarf sign.  [facility] at 140 deg of aBduction, neg drop sign.  [facility] ER and IR w/out pain in left shoulder.  LABS:  Lab Results  Component Value Date   TSH 1.71 06/11/2016   Lab Results  Component Value Date   WBC 5.4 09/10/2016   HGB 15.7 09/10/2016   HCT 46 09/10/2016   MCV 88.8 07/22/2016   PLT 240 09/10/2016   Lab Results  Component Value Date   CREATININE 0.9 09/10/2016  BUN 13 09/10/2016   NA 141 09/10/2016   K 4.2 09/10/2016   CL 100 (L) 07/22/2016   CO2 29 07/22/2016   Lab Results  Component Value Date   ALT 76 (A) 09/10/2016   AST 41 (A) 09/10/2016   ALKPHOS 52 09/10/2016   Lab Results  Component Value Date   CHOL 142 09/10/2016   Lab Results  Component Value Date   HDL 43 09/10/2016   Lab Results  Component Value Date   LDLCALC 80 09/10/2016   Lab Results  Component Value Date   TRIG 93 09/10/2016   Lab Results  Component Value Date   CHOLHDL 4 06/11/2016   Lab Results  Component Value Date   HGBA1C 6.1 09/10/2016   HGBA1C 6.1 09/10/2016   POC A1c on 09/01/16 = 5.7%.  IMPRESSION AND PLAN:  1) Disequilibrium  with some various other sx's that are most likely manifestations of anxiety/panic. He is improved. Ativan prn has helped.  Gave a new rx for this today for 0.5mg  tabs, #30, rf x 1. Hx of palpitations assoc with these episodes, recent holter monitor worn--results pending.  2) Transaminasemia: review of biometric screening from the last several years via his employer has shown some mildly elevated AST/ALT. Hep B and Hep C screening labs drawn today.  Ordered limited abd u/s to eval for suspected hepatic steatosis.  3) Prediabetes: highest A1c was 6.1%--this year. Was 5.7% on recent POC testing here. Will recheck in 3 mo.  He'll work on TLC.  4) Left AC joint sprain: discussed rest, ibup 600 mg bid x 10d, and ROM exercises (first w/out resistance, then with theraband when no pain w/out resistance.  An After Visit Summary was printed and given to the patient.  FOLLOW UP: Return in about 3 months (around 12/28/2016) for routine chronic illness f/u (fasting).  Signed:  Santiago Bumpers, MD           09/27/2016

## 2016-09-28 ENCOUNTER — Encounter: Payer: Self-pay | Admitting: *Deleted

## 2016-09-28 LAB — HEPATITIS C ANTIBODY: HCV Ab: NONREACTIVE

## 2016-09-28 LAB — HEPATITIS B SURFACE ANTIGEN: HEP B S AG: NONREACTIVE

## 2016-09-29 ENCOUNTER — Encounter: Payer: Self-pay | Admitting: Family Medicine

## 2016-10-01 ENCOUNTER — Encounter: Payer: Self-pay | Admitting: *Deleted

## 2016-10-15 ENCOUNTER — Ambulatory Visit (INDEPENDENT_AMBULATORY_CARE_PROVIDER_SITE_OTHER): Payer: BLUE CROSS/BLUE SHIELD

## 2016-10-15 ENCOUNTER — Encounter: Payer: Self-pay | Admitting: Family Medicine

## 2016-10-15 DIAGNOSIS — R7401 Elevation of levels of liver transaminase levels: Secondary | ICD-10-CM

## 2016-10-15 DIAGNOSIS — K760 Fatty (change of) liver, not elsewhere classified: Secondary | ICD-10-CM

## 2016-10-15 DIAGNOSIS — R740 Nonspecific elevation of levels of transaminase and lactic acid dehydrogenase [LDH]: Principal | ICD-10-CM

## 2016-11-11 DIAGNOSIS — Z23 Encounter for immunization: Secondary | ICD-10-CM | POA: Diagnosis not present

## 2016-12-31 ENCOUNTER — Other Ambulatory Visit: Payer: Self-pay

## 2016-12-31 ENCOUNTER — Encounter: Payer: Self-pay | Admitting: Family Medicine

## 2016-12-31 ENCOUNTER — Ambulatory Visit: Payer: BLUE CROSS/BLUE SHIELD | Admitting: Family Medicine

## 2016-12-31 VITALS — BP 110/68 | HR 60 | Temp 97.6°F | Resp 16 | Ht 71.0 in | Wt 221.2 lb

## 2016-12-31 DIAGNOSIS — R030 Elevated blood-pressure reading, without diagnosis of hypertension: Secondary | ICD-10-CM | POA: Diagnosis not present

## 2016-12-31 DIAGNOSIS — Z23 Encounter for immunization: Secondary | ICD-10-CM

## 2016-12-31 DIAGNOSIS — R7303 Prediabetes: Secondary | ICD-10-CM

## 2016-12-31 DIAGNOSIS — K7581 Nonalcoholic steatohepatitis (NASH): Secondary | ICD-10-CM | POA: Diagnosis not present

## 2016-12-31 DIAGNOSIS — F410 Panic disorder [episodic paroxysmal anxiety] without agoraphobia: Secondary | ICD-10-CM | POA: Diagnosis not present

## 2016-12-31 LAB — HEMOGLOBIN A1C: HEMOGLOBIN A1C: 6 % (ref 4.6–6.5)

## 2016-12-31 LAB — HEPATIC FUNCTION PANEL
ALBUMIN: 4.4 g/dL (ref 3.5–5.2)
ALT: 47 U/L (ref 0–53)
AST: 27 U/L (ref 0–37)
Alkaline Phosphatase: 48 U/L (ref 39–117)
Bilirubin, Direct: 0.1 mg/dL (ref 0.0–0.3)
TOTAL PROTEIN: 6.9 g/dL (ref 6.0–8.3)
Total Bilirubin: 0.6 mg/dL (ref 0.2–1.2)

## 2016-12-31 NOTE — Addendum Note (Signed)
Addended by: Regan RakersAY, LAURA K on: 12/31/2016 08:31 AM   Modules accepted: Orders

## 2016-12-31 NOTE — Progress Notes (Signed)
OFFICE VISIT  12/31/2016   CC:  Chief Complaint  Patient presents with  . Follow-up    RCI, pt is fasting.     HPI:    Patient is a 41 y.o. Caucasian male who presents for 3 mo f/u prediabetes, mild NASH, panic/ anxiety, and elevated blood pressure w/out dx of HTN.  Panic/anxiety: seems improved, takes occ ativan esp around times of business trips.  Has been learning some self-calming techniques.  Still has half bottle left of the #30 I rx'd 3 mo ago.  BP: home monitoring shows normal for the most part, occ elevation when anxiety/stress goes up.  Diet: no dietary changes since last visit except no sodas. Exercise: none at this time.  Prediabetes: Home glucose monitoring occ shows normal readings.  ROS: no chest pain, no dizziness, no SOB, no LE swelling, no HA's, no n/v, no abd pain, no melena or hematochezia.  Past Medical History:  Diagnosis Date  . Adjustment disorder with anxiety   . BPH with obstruction/lower urinary tract symptoms 2018   Pt declined bph med as of 06/2016  . Disequilibrium syndrome   . Elevated blood pressure reading without diagnosis of hypertension   . Elevated transaminase level 09/10/2016   [facility] wellness labs--AST 41, ALT 76.  Marland Kitchen. Hay fever   . Hepatic steatosis    u/s 09/2016  . Prediabetes    via work labs 2017/18; A1c 6.2% here 05/2016.  Improved to 5.7% 08/2016.  Then, A1c via [facility] on 09/10/16 was 6.1%.    Past Surgical History:  Procedure Laterality Date  . HOLTER MONITOR  09/2016   NSR with occ sinus brady, rare PVC.    Outpatient Medications Prior to Visit  Medication Sig Dispense Refill  . ALBUTEROL IN Inhale into the lungs.    Marland Kitchen. aspirin 81 MG chewable tablet Chew 1 tablet (81 mg total) by mouth daily. 30 tablet 1  . LORazepam (ATIVAN) 0.5 MG tablet 1-2 tabs po bid prn anxiety/stress 30 tablet 1   No facility-administered medications prior to visit.     No Known Allergies  ROS As per HPI  PE: Blood pressure 110/68,  pulse 60, temperature 97.6 F (36.4 C), temperature source Oral, resp. rate 16, height 5\' 11"  (1.803 m), weight 221 lb 4 oz (100.4 kg), SpO2 97 %. Gen: Alert, well appearing.  Patient is oriented to person, place, time, and situation. AFFECT: pleasant, lucid thought and speech. CV: RRR, no m/r/g.   LUNGS: CTA bilat, nonlabored resps, good aeration in all lung fields.   LABS:  Lab Results  Component Value Date   TSH 1.71 06/11/2016   Lab Results  Component Value Date   WBC 5.4 09/10/2016   HGB 15.7 09/10/2016   HCT 46 09/10/2016   MCV 88.8 07/22/2016   PLT 240 09/10/2016   Lab Results  Component Value Date   CREATININE 0.9 09/10/2016   BUN 13 09/10/2016   NA 141 09/10/2016   K 4.2 09/10/2016   CL 100 (L) 07/22/2016   CO2 29 07/22/2016   Lab Results  Component Value Date   ALT 76 (A) 09/10/2016   AST 41 (A) 09/10/2016   ALKPHOS 52 09/10/2016   Lab Results  Component Value Date   CHOL 142 09/10/2016   Lab Results  Component Value Date   HDL 43 09/10/2016   Lab Results  Component Value Date   LDLCALC 80 09/10/2016   Lab Results  Component Value Date   TRIG 93 09/10/2016  Lab Results  Component Value Date   CHOLHDL 4 06/11/2016   Lab Results  Component Value Date   HGBA1C 6.1 09/10/2016   HGBA1C 6.1 09/10/2016    IMPRESSION AND PLAN:  1) Prediabetes:  Needs to continue to work harder on dietary changes and needs to get started with exercising. Recheck A1c today.  2) NASH: very mild elevation of LFTs.  Hep screening NEG, u/s abd showed hepatic steatosis changes. Recheck hepatic panel today.  3) Elevated bp w/out dx HTN: only occ elevated bp when stressed. Continue to monitor periodically at home.  Call/return if bp consistently >140/90.  4) Prev health care: Tdap today.  An After Visit Summary was printed and given to the patient.  FOLLOW UP: Return in about 6 months (around 06/30/2017) for annual CPE (fasting).  Signed:  Santiago BumpersPhil Corlette Ciano, MD            12/31/2016

## 2017-01-14 ENCOUNTER — Encounter: Payer: Self-pay | Admitting: Family Medicine

## 2017-01-14 ENCOUNTER — Ambulatory Visit: Payer: BLUE CROSS/BLUE SHIELD | Admitting: Family Medicine

## 2017-01-14 ENCOUNTER — Other Ambulatory Visit: Payer: Self-pay

## 2017-01-14 VITALS — BP 112/80 | HR 74 | Temp 98.0°F | Resp 16 | Ht 71.0 in | Wt 218.4 lb

## 2017-01-14 DIAGNOSIS — W57XXXA Bitten or stung by nonvenomous insect and other nonvenomous arthropods, initial encounter: Secondary | ICD-10-CM

## 2017-01-14 DIAGNOSIS — S30861A Insect bite (nonvenomous) of abdominal wall, initial encounter: Secondary | ICD-10-CM | POA: Diagnosis not present

## 2017-01-14 DIAGNOSIS — L03311 Cellulitis of abdominal wall: Secondary | ICD-10-CM | POA: Diagnosis not present

## 2017-01-14 MED ORDER — DOXYCYCLINE HYCLATE 100 MG PO TABS: 100.0000 mg | ORAL_TABLET | Freq: Two times a day (BID) | ORAL | 0 refills | Status: DC

## 2017-01-14 NOTE — Progress Notes (Signed)
   Subjective:    Patient ID: Barkley BrunsBo Skalsky, male    DOB: 03/16/1975, 41 y.o.   MRN: [id number]  HPI Cellulitis- last weekend had an 'area of discomfort' and thought it was a pimple.  Pt pulled a hair out and then developed increased swelling, redness, pain.  No drainage.  No known bite or sting.     Review of Systems For ROS see HPI     Objective:   Physical Exam  Constitutional: He appears well-developed and well-nourished. No distress.  HENT:  Head: Normocephalic and atraumatic.  Skin: Skin is warm and dry. There is erythema (7 cm area of erythema w/ central induration on L lower abd.  no abscess present).  Psychiatric: He has a normal mood and affect. His behavior is normal. Thought content normal.  Vitals reviewed.         Assessment & Plan:  Cellulitis- new.  Pt's abdominal wall is clearly infected.  Start Doxy.  Reviewed supportive care and red flags that should prompt return.  Pt expressed understanding and is in agreement w/ plan.   ? Tick Bite- pt is very concerned about possible Lyme disease despite no known tick bite and no central clearing or target rash that would typically be seen.  Will get labs at pt's request for 'peace of mind'.

## 2017-01-14 NOTE — Patient Instructions (Signed)
Follow up as needed or as schedule We'll notify you of your lab results and make any changes if needed Start the Doxycycline twice daily- take w/ food Try and avoid picking or scratching the area.  Keep clean and dry If the redness worsens after 2 days of antibiotics or is rapidly spreading- please let us know! Call with any questions or concerns Happy Holidays!!!

## 2017-01-17 LAB — B. BURGDORFI ANTIBODIES

## 2017-04-15 ENCOUNTER — Other Ambulatory Visit: Payer: Self-pay

## 2017-04-15 ENCOUNTER — Ambulatory Visit: Payer: BLUE CROSS/BLUE SHIELD | Admitting: Family Medicine

## 2017-04-15 ENCOUNTER — Encounter: Payer: Self-pay | Admitting: Family Medicine

## 2017-04-15 VITALS — BP 118/82 | HR 77 | Temp 97.9°F | Ht 71.0 in | Wt 214.4 lb

## 2017-04-15 DIAGNOSIS — L0201 Cutaneous abscess of face: Secondary | ICD-10-CM

## 2017-04-15 HISTORY — DX: Cutaneous abscess of face: L02.01

## 2017-04-15 NOTE — Patient Instructions (Signed)
Please follow up if symptoms do not improve or as needed.   Skin Abscess A skin abscess is an infected area on or under your skin that contains a collection of pus and other material. An abscess may also be called a furuncle, carbuncle, or boil. An abscess can occur in or on almost any part of your body. Some abscesses break open (rupture) on their own. Most continue to get worse unless they are treated. The infection can spread deeper into the body and eventually into your blood, which can make you feel ill. Treatment usually involves draining the abscess. What are the causes? An abscess occurs when germs, often bacteria, pass through your skin and cause an infection. This may be caused by:  A scrape or cut on your skin.  A puncture wound through your skin, including a needle injection.  Blocked oil or sweat glands.  Blocked and infected hair follicles.  A cyst that forms beneath your skin (sebaceous cyst) and becomes infected.  What increases the risk? This condition is more likely to develop in people who:  Have a weak body defense system (immune system).  Have diabetes.  Have dry and irritated skin.  Get frequent injections or use illegal IV drugs.  Have a foreign body in a wound, such as a splinter.  Have problems with their lymph system or veins.  What are the signs or symptoms? An abscess may start as a painful, firm bump under the skin. Over time, the abscess may get larger or become softer. Pus may appear at the top of the abscess, causing pressure and pain. It may eventually break through the skin and drain. Other symptoms include:  Redness.  Warmth.  Swelling.  Tenderness.  A sore on the skin.  How is this diagnosed? This condition is diagnosed based on your medical history and a physical exam. A sample of pus may be taken from the abscess to find out what is causing the infection and what antibiotics can be used to treat it. You also may have:  Blood tests  to look for signs of infection or spread of an infection to your blood.  Imaging studies such as ultrasound, CT scan, or MRI if the abscess is deep.  How is this treated? Small abscesses that drain on their own may not need treatment. Treatment for an abscess that does not rupture on its own may include:  Warm compresses applied to the area several times per day.  Incision and drainage. Your health care provider will make an incision to open the abscess and will remove pus and any foreign body or dead tissue. The incision area may be packed with gauze to keep it open for a few days while it heals.  Antibiotic medicines to treat infection. For a severe abscess, you may first get antibiotics through an IV and then change to oral antibiotics.  Follow these instructions at home: Abscess Care  If you have an abscess that has not drained, place a warm, clean, wet washcloth over the abscess several times a day. Do this as told by your health care provider.  Follow instructions from your health care provider about how to take care of your abscess. Make sure you: ? Cover the abscess with a bandage (dressing). ? Change your dressing or gauze as told by your health care provider. ? Wash your hands with soap and water before you change the dressing or gauze. If soap and water are not available, use hand sanitizer.  Check  your abscess every day for signs of a worsening infection. Check for: ? More redness, swelling, or pain. ? More fluid or blood. ? Warmth. ? More pus or a bad smell. Medicines  Take over-the-counter and prescription medicines only as told by your health care provider.  If you were prescribed an antibiotic medicine, take it as told by your health care provider. Do not stop taking the antibiotic even if you start to feel better. General instructions  To avoid spreading the infection: ? Do not share personal care items, towels, or hot tubs with others. ? Avoid making skin  contact with other people.  Keep all follow-up visits as told by your health care provider. This is important. Contact a health care provider if:  You have more redness, swelling, or pain around your abscess.  You have more fluid or blood coming from your abscess.  Your abscess feels warm to the touch.  You have more pus or a bad smell coming from your abscess.  You have a fever.  You have muscle aches.  You have chills or a general ill feeling. Get help right away if:  You have severe pain.  You see red streaks on your skin spreading away from the abscess. This information is not intended to replace advice given to you by your health care provider. Make sure you discuss any questions you have with your health care provider. Document Released: 11/11/2004 Document Revised: 09/28/2015 Document Reviewed: 12/11/2014 Elsevier Interactive Patient Education  [employer].

## 2017-04-15 NOTE — Progress Notes (Signed)
   Subjective  CC:  Chief Complaint  Patient presents with  . Facial Swelling    Patient states that he has seen urgent care and is on Antibiotics, swelling seems to be spreading on face, has a hard nodule on face about eye    HPI: Joshua Page is a 42 y.o. male who presents to the office today to address the problems listed above in the chief complaint.  As above, treated for abscess on left temporal region 2 days ago. Had large egg sized red warm area, now smaller but more firm and painful w/o drainage, fevers, painful eye movements. Has noted some swelling on left side of face. Taking septra bid.    I reviewed the patients updated PMH, FH, and SocHx.    Patient Active Problem List   Diagnosis Date Noted  . Prediabetes    Current Meds  Medication Sig  . ALBUTEROL IN Inhale into the lungs.  Marland Kitchen. aspirin 81 MG chewable tablet Chew 1 tablet (81 mg total) by mouth daily.  Marland Kitchen. LORazepam (ATIVAN) 0.5 MG tablet 1-2 tabs po bid prn anxiety/stress    Allergies: Patient has No Known Allergies. Family History: Patient family history includes Alcohol abuse in his father; Diabetes in his father and maternal grandmother; Mental illness in his father; Pancreatic cancer in his maternal grandfather. Social History:  Patient  reports that he quit smoking about 4 years ago. His smoking use included cigarettes. He has a 3.75 pack-year smoking history. He quit smokeless tobacco use about 3 years ago. His smokeless tobacco use included chew. He reports that he does not drink alcohol or use drugs.  Review of Systems: Constitutional: Negative for fever malaise or anorexia Cardiovascular: negative for chest pain Respiratory: negative for SOB or persistent cough Gastrointestinal: negative for abdominal pain  Objective  Vitals: BP 118/82   Pulse 77   Temp 97.9 F (36.6 C)   Ht 5\' 11"  (1.803 m)   Wt 214 lb 6.4 oz (97.3 kg)   BMI 29.90 kg/m  General: no acute distress , A&Ox3 HEENT: PEERL, conjunctiva  normal, Oropharynx moist,neck is supple Cardiovascular:  RRR without murmur or gallop.  Respiratory:  Good breath sounds bilaterally, CTAB with normal respiratory effort Skin:  Warm, no rashes  Assessment  1. Cutaneous abscess of face      Plan   Abscess, skin:  Reassured. Improving. Continue abx. Start advil tid and warm compresses. Rest. F/u if fevers develop or worsening pain/swelling. Will take 1-2 weeks to resolve.   Follow up: prn    Commons side effects, risks, benefits, and alternatives for medications and treatment plan prescribed today were discussed, and the patient expressed understanding of the given instructions. Patient is instructed to call or message via MyChart if he/she has any questions or concerns regarding our treatment plan. No barriers to understanding were identified. We discussed Red Flag symptoms and signs in detail. Patient expressed understanding regarding what to do in case of urgent or emergency type symptoms.   Medication list was reconciled, printed and provided to the patient in AVS. Patient instructions and summary information was reviewed with the patient as documented in the AVS. This note was prepared with assistance of Dragon voice recognition software. Occasional wrong-word or sound-a-like substitutions may have occurred due to the inherent limitations of voice recognition software  No orders of the defined types were placed in this encounter.  No orders of the defined types were placed in this encounter.

## 2017-04-16 ENCOUNTER — Encounter: Payer: Self-pay | Admitting: Family Medicine

## 2017-04-18 ENCOUNTER — Encounter: Payer: Self-pay | Admitting: Family Medicine

## 2017-04-18 ENCOUNTER — Ambulatory Visit: Payer: BLUE CROSS/BLUE SHIELD | Admitting: Family Medicine

## 2017-04-18 VITALS — BP 144/75 | HR 66 | Temp 98.2°F | Resp 16 | Ht 71.0 in | Wt 215.2 lb

## 2017-04-18 DIAGNOSIS — F410 Panic disorder [episodic paroxysmal anxiety] without agoraphobia: Secondary | ICD-10-CM

## 2017-04-18 DIAGNOSIS — L0201 Cutaneous abscess of face: Secondary | ICD-10-CM | POA: Diagnosis not present

## 2017-04-18 DIAGNOSIS — F411 Generalized anxiety disorder: Secondary | ICD-10-CM

## 2017-04-18 MED ORDER — CITALOPRAM HYDROBROMIDE 20 MG PO TABS: 20.0000 mg | ORAL_TABLET | Freq: Every day | ORAL | 1 refills | Status: DC

## 2017-04-18 MED ORDER — MUPIROCIN 2 % EX OINT: 1.0000 "application " | TOPICAL_OINTMENT | Freq: Three times a day (TID) | CUTANEOUS | 0 refills | Status: DC

## 2017-04-18 NOTE — Progress Notes (Signed)
OFFICE VISIT  04/18/2017   CC:  Chief Complaint  Patient presents with  . Follow-up    abscess on face and Anxiety   HPI:    Patient is a 42 y.o. Caucasian male who presents for f/u abscess on face. Last week dx'd with cellulitis at an out of town UC, had abscess as well (located in L temple region of face, says it started "like a pimple" and he picked at it).  No I&D was needed at that time apparently, doxy started. Was seen for f/u 04/15/17 by Dr. Mardelle MatteAndy and abx (doxy) were continued and it was improved in size and color in review of note.   No fevers.  No malaise.   The L temple area is feeling much better last 3 d, just feels harder than before and is still swollen.  He wonders if it needs any further treatment like incision and drainage.  Was having adjustment d/o with anxiety about 6 mo ago.  With further discussion at that time it was evident that he had significant anxiety and tendency towards panic long term but had a "crutch" much of the time (like alcohol in remote past, tobacco in recent past).  He declined antidepressant at that time, tried prn ativan. Getting worse, some family members died recently.  Now getting panicky on planes, driving, going to church. Not using ativan much, but he notes that it helps a lot.  He is hesitant to start daily med but is agreeable if I strongly suggest this.  He has appt with Dr. Dewayne HatchMendelson for counseling already set up.   Past Medical History:  Diagnosis Date  . Adjustment disorder with anxiety   . BPH with obstruction/lower urinary tract symptoms 2018   Pt declined bph med as of 06/2016  . Cutaneous abscess of face 04/2017  . Disequilibrium syndrome   . Elevated blood pressure reading without diagnosis of hypertension   . Elevated transaminase level 09/10/2016   [facility] wellness labs--AST 41, ALT 76.  Marland Kitchen. Hay fever   . Hepatic steatosis    u/s 09/2016  . Prediabetes    via work labs 2017/18; A1c 6.2% here 05/2016.  Improved to 5.7% 08/2016.   Then, A1c via [facility] on 09/10/16 was 6.1%.    Past Surgical History:  Procedure Laterality Date  . HOLTER MONITOR  09/2016   NSR with occ sinus brady, rare PVC.    Outpatient Medications Prior to Visit  Medication Sig Dispense Refill  . ALBUTEROL IN Inhale into the lungs.    Marland Kitchen. aspirin 81 MG chewable tablet Chew 1 tablet (81 mg total) by mouth daily. 30 tablet 1  . LORazepam (ATIVAN) 0.5 MG tablet 1-2 tabs po bid prn anxiety/stress 30 tablet 1  . sulfamethoxazole-trimethoprim (BACTRIM DS,SEPTRA DS) 800-160 MG tablet      No facility-administered medications prior to visit.     No Known Allergies  ROS As per HPI  PE: Blood pressure (!) 144/75, pulse 66, temperature 98.2 F (36.8 C), temperature source Oral, resp. rate 16, height 5\' 11"  (1.803 m), weight 215 lb 4 oz (97.6 kg), SpO2 96 %. Gen: Alert, well appearing.  Patient is oriented to person, place, time, and situation. AFFECT: pleasant, lucid thought and speech. Left temple/hairline with 2 cm subQ nodular fluctuance with some surrounding induration.  Central drainage tract with dried mucous --no active drainage.  Mild erythema overlying the swelling but none surrounding it. Moderate induration of soft tissue immediately surrounding the nodule/fluctuance.  Minimal TTP.  No streaking.  LABS:  None today  IMPRESSION AND PLAN:  1) Left temple area of face with cutaneous abscess, improving and starting to drain a little using warm compresses. I used sterile 18 gauge needle to puncture the fluctuant part in office today, expressed small amount of serosanguinous fluid, culture swab of drainage was obtained.  Pt tolerated procedure well, no immediate complications. Home care discussed, start bactroban ointment tid.  Continue warm compresses and expression of contents at least bid. Finish doxycycline.   Signs/symptoms to call or return for were reviewed and pt expressed understanding.  2) GAD with panic: start citalopram 20mg   qd. Therapeutic expectations and side effect profile of medication discussed today.  Patient's questions answered. May continue ativan 0.5 mg, 1-2 bid prn.  No new rx for this med was needed today. He has counseling appt with Dr. Dewayne Hatch already set up.  An After Visit Summary was printed and given to the patient.  FOLLOW UP: Return in about 3 days (around 04/21/2017) for 30 min spot if possible (15 min ok if no 30 min available).  Signed:  Santiago Bumpers, MD           04/18/2017

## 2017-04-21 ENCOUNTER — Ambulatory Visit (INDEPENDENT_AMBULATORY_CARE_PROVIDER_SITE_OTHER): Payer: BLUE CROSS/BLUE SHIELD | Admitting: Family Medicine

## 2017-04-21 ENCOUNTER — Other Ambulatory Visit: Payer: Self-pay | Admitting: Family Medicine

## 2017-04-21 ENCOUNTER — Encounter: Payer: Self-pay | Admitting: Family Medicine

## 2017-04-21 VITALS — BP 127/86 | HR 57 | Temp 97.9°F | Resp 16 | Wt 211.0 lb

## 2017-04-21 DIAGNOSIS — F410 Panic disorder [episodic paroxysmal anxiety] without agoraphobia: Secondary | ICD-10-CM | POA: Diagnosis not present

## 2017-04-21 DIAGNOSIS — F411 Generalized anxiety disorder: Secondary | ICD-10-CM

## 2017-04-21 DIAGNOSIS — L0201 Cutaneous abscess of face: Secondary | ICD-10-CM

## 2017-04-21 LAB — WOUND CULTURE
MICRO NUMBER:: 90278517
SPECIMEN QUALITY:: ADEQUATE

## 2017-04-21 MED ORDER — LORAZEPAM 0.5 MG PO TABS: ORAL_TABLET | ORAL | 1 refills | Status: DC

## 2017-04-21 NOTE — Progress Notes (Signed)
OFFICE VISIT  04/21/2017   CC:  Chief Complaint  Patient presents with  . Follow-up    check abcess/ anxiety   HPI:    Patient is a 42 y.o. Caucasian male who presents for 3d f/u small abscess w/slight cellulitis on left temple area of face. Last visit I did "puncture" and slight drainage of the wound, started bactroban, kept him on his bactrim, and sent wound clx. Clx result shows MSSA, + sensitive to bactrim among others.  The area has continued to improve/shrink.  Has one more day of bactrim.  No fevers.  Using heat as instructed.  No pain.  No redness or hard areas at the site anymore.   I also started him on citalopram 20mg  qd and ativan bid prn last visit for signif GAD with periods of panic. He noted a period of feeling a little early panic sx's for about 1.5 hours yesterday when he took his daughter to an [place] appointment in [place] yesterday.  Had insomnia when he took it the first night, so he started taking it in morning after that. Feeling good today.  Has not had to use ativan since I saw him a few days ago. No manic sx's.  Past Medical History:  Diagnosis Date  . Adjustment disorder with anxiety   . BPH with obstruction/lower urinary tract symptoms 2018   Pt declined bph med as of 06/2016  . Cutaneous abscess of face 04/2017  . Disequilibrium syndrome   . Elevated blood pressure reading without diagnosis of hypertension   . Elevated transaminase level 09/10/2016   [facility] wellness labs--AST 41, ALT 76.  Marland Kitchen. Hay fever   . Hepatic steatosis    u/s 09/2016  . Prediabetes    via work labs 2017/18; A1c 6.2% here 05/2016.  Improved to 5.7% 08/2016.  Then, A1c via [facility] on 09/10/16 was 6.1%.    Past Surgical History:  Procedure Laterality Date  . HOLTER MONITOR  09/2016   NSR with occ sinus brady, rare PVC.    Outpatient Medications Prior to Visit  Medication Sig Dispense Refill  . aspirin 81 MG chewable tablet Chew 1 tablet (81 mg total) by mouth daily. 30 tablet 1  .  citalopram (CELEXA) 20 MG tablet Take 1 tablet (20 mg total) by mouth daily. 30 tablet 1  . LORazepam (ATIVAN) 0.5 MG tablet 1-2 tabs po bid prn anxiety/stress 30 tablet 1  . mupirocin ointment (BACTROBAN) 2 % Apply 1 application topically 3 (three) times daily. 15 g 0  . sulfamethoxazole-trimethoprim (BACTRIM DS,SEPTRA DS) 800-160 MG tablet     . ALBUTEROL IN Inhale into the lungs.     No facility-administered medications prior to visit.     No Known Allergies  ROS As per HPI  PE: Blood pressure 127/86, pulse (!) 57, temperature 97.9 F (36.6 C), temperature source Oral, resp. rate 16, weight 211 lb (95.7 kg), SpO2 99 %. Gen: Alert, well appearing.  Patient is oriented to person, place, time, and situation. AFFECT: pleasant, lucid thought and speech. Left temple with slight focal swelling with a trace of fluctuance in the center, with punctate drainage hole in center (with no active drainage).  No erythema, induration, or tenderness.    LABS:  none  IMPRESSION AND PLAN:  1) Cutaneous abscess of L temple region of face (MSSA on clx)--signif improved.  Finish bactrim, continue tid bactroban to wound, continue heat and express any further contents.   Signs/symptoms to call or return for were reviewed  and pt expressed understanding.  2) GAD, with panic attacks. Doing ok on citalopram since starting 3 d/a. Has ativan to use bid prn--will RF this rx today.  An After Visit Summary was printed and given to the patient.  FOLLOW UP: Return in about 4 weeks (around 05/19/2017) for f/u GAD/med.  Signed:  Santiago Bumpers, MD           04/21/2017

## 2017-05-03 ENCOUNTER — Ambulatory Visit: Payer: BLUE CROSS/BLUE SHIELD | Admitting: Clinical

## 2017-05-03 DIAGNOSIS — F4323 Adjustment disorder with mixed anxiety and depressed mood: Secondary | ICD-10-CM

## 2017-05-11 ENCOUNTER — Encounter: Payer: Self-pay | Admitting: Family Medicine

## 2017-05-11 MED ORDER — CITALOPRAM HYDROBROMIDE 20 MG PO TABS: 20.0000 mg | ORAL_TABLET | Freq: Every day | ORAL | 0 refills | Status: DC

## 2017-05-19 DIAGNOSIS — F410 Panic disorder [episodic paroxysmal anxiety] without agoraphobia: Secondary | ICD-10-CM | POA: Insufficient documentation

## 2017-05-19 DIAGNOSIS — F411 Generalized anxiety disorder: Secondary | ICD-10-CM | POA: Insufficient documentation

## 2017-05-20 ENCOUNTER — Encounter: Payer: Self-pay | Admitting: Family Medicine

## 2017-05-20 ENCOUNTER — Ambulatory Visit: Payer: BLUE CROSS/BLUE SHIELD | Admitting: Family Medicine

## 2017-05-20 VITALS — BP 111/75 | HR 64 | Temp 98.2°F | Resp 16 | Ht 71.0 in | Wt 218.5 lb

## 2017-05-20 DIAGNOSIS — F410 Panic disorder [episodic paroxysmal anxiety] without agoraphobia: Secondary | ICD-10-CM

## 2017-05-20 DIAGNOSIS — F411 Generalized anxiety disorder: Secondary | ICD-10-CM | POA: Diagnosis not present

## 2017-05-20 NOTE — Progress Notes (Signed)
OFFICE VISIT  05/20/2017   CC:  Chief Complaint  Patient presents with  . Follow-up    GAD   HPI:    Patient is a 42 y.o. Caucasian male who presents for 1 mo f/u GAD with panic. I started him on citalopram 20mg  qd about 1 mo ago.  He is also on ativan 0.5mg , 1-2 bid prn. Feels much improved regarding anxiety and feeling irritable or edgy.  Recent airplane travel--usual high anxiety was not as bad.  He did take ativan at that time and it did help.  He doesn't use this med other than when he travels or is in a place with a large crowd. No side effects from meds.  He is getting counseling with Dr. Dewayne HatchMendelson and feels like this is helping/going well.  Past Medical History:  Diagnosis Date  . Adjustment disorder with anxiety   . BPH with obstruction/lower urinary tract symptoms 2018   Pt declined bph med as of 06/2016  . Cutaneous abscess of face 04/2017  . Disequilibrium syndrome   . Elevated blood pressure reading without diagnosis of hypertension   . Elevated transaminase level 09/10/2016   [facility] wellness labs--AST 41, ALT 76.  Marland Kitchen. Hay fever   . Hepatic steatosis    u/s 09/2016  . Prediabetes    via work labs 2017/18; A1c 6.2% here 05/2016.  Improved to 5.7% 08/2016.  Then, A1c via [facility] on 09/10/16 was 6.1%.    Past Surgical History:  Procedure Laterality Date  . HOLTER MONITOR  09/2016   NSR with occ sinus brady, rare PVC.    Outpatient Medications Prior to Visit  Medication Sig Dispense Refill  . ALBUTEROL IN Inhale into the lungs.    Marland Kitchen. aspirin 81 MG chewable tablet Chew 1 tablet (81 mg total) by mouth daily. 30 tablet 1  . citalopram (CELEXA) 20 MG tablet Take 1 tablet (20 mg total) by mouth daily. 90 tablet 0  . LORazepam (ATIVAN) 0.5 MG tablet 1-2 tabs po bid prn anxiety/stress 30 tablet 1  . mupirocin ointment (BACTROBAN) 2 % Apply 1 application topically 3 (three) times daily. 15 g 0  . sulfamethoxazole-trimethoprim (BACTRIM DS,SEPTRA DS) 800-160 MG tablet       No facility-administered medications prior to visit.     No Known Allergies  ROS As per HPI  PE: Blood pressure 111/75, pulse 64, temperature 98.2 F (36.8 C), temperature source Oral, resp. rate 16, height 5\' 11"  (1.803 m), weight 218 lb 8 oz (99.1 kg), SpO2 97 %. Wt Readings from Last 2 Encounters:  05/20/17 218 lb 8 oz (99.1 kg)  04/21/17 211 lb (95.7 kg)    Gen: alert, oriented x 4, affect pleasant.  Lucid thinking and conversation noted. HEENT: PERRLA, EOMI.   Neck: no LAD, mass, or thyromegaly. CV: RRR, no m/r/g LUNGS: CTA bilat, nonlabored. NEURO: no tremor or tics noted on observation.  Coordination intact. CN 2-12 grossly intact bilaterally, strength 5/5 in all extremeties.  No ataxia.   LABS:    Chemistry      Component Value Date/Time   NA 141 09/10/2016   K 4.2 09/10/2016   CL 100 (L) 07/22/2016 1517   CO2 29 07/22/2016 1517   BUN 13 09/10/2016   CREATININE 0.9 09/10/2016   CREATININE 0.76 07/22/2016 1517   GLU 114 09/10/2016      Component Value Date/Time   CALCIUM 9.3 07/22/2016 1517   ALKPHOS 48 12/31/2016 0816   AST 27 12/31/2016 0816  ALT 47 12/31/2016 0816   BILITOT 0.6 12/31/2016 0816       IMPRESSION AND PLAN:  GAD with panic: responding very well to citalopram, plus he is using ativan appropriately and getting benefit. The current medical regimen is effective;  continue present plan and medications. Continue with counseling with Dr. Dewayne Hatch.  An After Visit Summary was printed and given to the patient.  FOLLOW UP: Return in about 3 months (around 08/19/2017) for annual CPE (fasting).+ f/u anxiety.  Signed:  Santiago Bumpers, MD           05/20/2017

## 2017-05-30 ENCOUNTER — Ambulatory Visit: Payer: BLUE CROSS/BLUE SHIELD | Admitting: Clinical

## 2017-06-06 ENCOUNTER — Other Ambulatory Visit: Payer: Self-pay | Admitting: *Deleted

## 2017-06-06 MED ORDER — CITALOPRAM HYDROBROMIDE 20 MG PO TABS: 20.0000 mg | ORAL_TABLET | Freq: Every day | ORAL | 1 refills | Status: DC

## 2017-06-08 ENCOUNTER — Telehealth: Payer: Self-pay | Admitting: Family Medicine

## 2017-06-08 MED ORDER — CITALOPRAM HYDROBROMIDE 20 MG PO TABS: 20.0000 mg | ORAL_TABLET | Freq: Every day | ORAL | 0 refills | Status: DC

## 2017-06-08 NOTE — Telephone Encounter (Signed)
Copied from CRM [id number]. Topic: Quick Communication - Rx Refill/Question >> Jun 08, 2017  3:59 PM Raquel SarnaHayes, Teresa G wrote: citalopram (CELEXA) 20 MG tablet  Pt only needs 5 pills since he left them at home.  He is now on vacation and needing enough to last for the week.  [facility],  [place] - phone - [phone]

## 2017-06-08 NOTE — Telephone Encounter (Signed)
Rx sent for #5. Pt advised and voiced understanding.

## 2017-06-13 ENCOUNTER — Ambulatory Visit: Payer: BLUE CROSS/BLUE SHIELD | Admitting: Clinical

## 2017-06-13 DIAGNOSIS — F4323 Adjustment disorder with mixed anxiety and depressed mood: Secondary | ICD-10-CM | POA: Diagnosis not present

## 2017-06-17 ENCOUNTER — Encounter: Payer: BLUE CROSS/BLUE SHIELD | Admitting: Family Medicine

## 2017-06-20 ENCOUNTER — Ambulatory Visit: Payer: BLUE CROSS/BLUE SHIELD | Admitting: Clinical

## 2017-06-20 DIAGNOSIS — F4323 Adjustment disorder with mixed anxiety and depressed mood: Secondary | ICD-10-CM | POA: Diagnosis not present

## 2017-07-04 ENCOUNTER — Ambulatory Visit: Payer: BLUE CROSS/BLUE SHIELD | Admitting: Clinical

## 2017-07-04 DIAGNOSIS — F4322 Adjustment disorder with anxiety: Secondary | ICD-10-CM | POA: Diagnosis not present

## 2017-07-18 ENCOUNTER — Ambulatory Visit: Payer: BLUE CROSS/BLUE SHIELD | Admitting: Clinical

## 2017-07-25 ENCOUNTER — Ambulatory Visit: Payer: BLUE CROSS/BLUE SHIELD | Admitting: Clinical

## 2017-08-01 ENCOUNTER — Ambulatory Visit: Payer: BLUE CROSS/BLUE SHIELD | Admitting: Clinical

## 2017-08-08 ENCOUNTER — Ambulatory Visit: Payer: BLUE CROSS/BLUE SHIELD | Admitting: Clinical

## 2017-08-08 DIAGNOSIS — F4323 Adjustment disorder with mixed anxiety and depressed mood: Secondary | ICD-10-CM

## 2017-08-15 ENCOUNTER — Ambulatory Visit: Payer: BLUE CROSS/BLUE SHIELD | Admitting: Clinical

## 2017-08-22 ENCOUNTER — Ambulatory Visit: Payer: BLUE CROSS/BLUE SHIELD | Admitting: Clinical

## 2017-08-29 ENCOUNTER — Ambulatory Visit: Payer: BLUE CROSS/BLUE SHIELD | Admitting: Clinical

## 2017-09-02 ENCOUNTER — Encounter: Payer: BLUE CROSS/BLUE SHIELD | Admitting: Family Medicine

## 2017-09-05 ENCOUNTER — Ambulatory Visit: Payer: BLUE CROSS/BLUE SHIELD | Admitting: Clinical

## 2017-09-05 DIAGNOSIS — F4323 Adjustment disorder with mixed anxiety and depressed mood: Secondary | ICD-10-CM | POA: Diagnosis not present

## 2017-09-13 ENCOUNTER — Encounter: Payer: Self-pay | Admitting: Family Medicine

## 2017-09-13 ENCOUNTER — Ambulatory Visit (INDEPENDENT_AMBULATORY_CARE_PROVIDER_SITE_OTHER): Payer: BLUE CROSS/BLUE SHIELD | Admitting: Family Medicine

## 2017-09-13 VITALS — BP 110/72 | HR 57 | Temp 97.8°F | Resp 16 | Ht 71.0 in | Wt 221.2 lb

## 2017-09-13 DIAGNOSIS — Z114 Encounter for screening for human immunodeficiency virus [HIV]: Secondary | ICD-10-CM | POA: Diagnosis not present

## 2017-09-13 DIAGNOSIS — K760 Fatty (change of) liver, not elsewhere classified: Secondary | ICD-10-CM | POA: Diagnosis not present

## 2017-09-13 DIAGNOSIS — E669 Obesity, unspecified: Secondary | ICD-10-CM

## 2017-09-13 DIAGNOSIS — R030 Elevated blood-pressure reading, without diagnosis of hypertension: Secondary | ICD-10-CM | POA: Diagnosis not present

## 2017-09-13 DIAGNOSIS — Z0000 Encounter for general adult medical examination without abnormal findings: Secondary | ICD-10-CM | POA: Diagnosis not present

## 2017-09-13 DIAGNOSIS — R7401 Elevation of levels of liver transaminase levels: Secondary | ICD-10-CM

## 2017-09-13 DIAGNOSIS — R7303 Prediabetes: Secondary | ICD-10-CM | POA: Diagnosis not present

## 2017-09-13 DIAGNOSIS — R740 Nonspecific elevation of levels of transaminase and lactic acid dehydrogenase [LDH]: Secondary | ICD-10-CM | POA: Diagnosis not present

## 2017-09-13 LAB — COMPREHENSIVE METABOLIC PANEL
ALBUMIN: 4.3 g/dL (ref 3.5–5.2)
ALK PHOS: 47 U/L (ref 39–117)
ALT: 55 U/L — ABNORMAL HIGH (ref 0–53)
AST: 29 U/L (ref 0–37)
BILIRUBIN TOTAL: 0.6 mg/dL (ref 0.2–1.2)
BUN: 14 mg/dL (ref 6–23)
CO2: 29 mEq/L (ref 19–32)
CREATININE: 0.8 mg/dL (ref 0.40–1.50)
Calcium: 9.1 mg/dL (ref 8.4–10.5)
Chloride: 104 mEq/L (ref 96–112)
GFR: 113.47 mL/min (ref >60.00)
Glucose, Bld: 113 mg/dL — ABNORMAL HIGH (ref 70–99)
Potassium: 4 mEq/L (ref 3.5–5.1)
SODIUM: 139 meq/L (ref 135–145)
TOTAL PROTEIN: 6.9 g/dL (ref 6.0–8.3)

## 2017-09-13 LAB — CBC WITH DIFFERENTIAL/PLATELET
BASOS ABS: 0.1 10*3/uL (ref 0.0–0.1)
Basophils Relative: 1.5 % (ref 0.0–3.0)
EOS ABS: 0.2 10*3/uL (ref 0.0–0.7)
Eosinophils Relative: 2.8 % (ref 0.0–5.0)
HEMATOCRIT: 44.7 % (ref 39.0–52.0)
Hemoglobin: 15.1 g/dL (ref 13.0–17.0)
LYMPHS PCT: 36.4 % (ref 12.0–46.0)
Lymphs Abs: 2.2 10*3/uL (ref 0.7–4.0)
MCHC: 33.7 g/dL (ref 30.0–36.0)
MCV: 89.7 fl (ref 78.0–100.0)
MONOS PCT: 9.1 % (ref 3.0–12.0)
Monocytes Absolute: 0.5 10*3/uL (ref 0.1–1.0)
NEUTROS ABS: 3 10*3/uL (ref 1.4–7.7)
Neutrophils Relative %: 50.2 % (ref 43.0–77.0)
PLATELETS: 250 10*3/uL (ref 150.0–400.0)
RBC: 4.99 Mil/uL (ref 4.22–5.81)
RDW: 13.1 % (ref 11.5–15.5)
WBC: 6 10*3/uL (ref 4.0–10.5)

## 2017-09-13 LAB — LIPID PANEL
CHOLESTEROL: 141 mg/dL (ref 0–200)
HDL: 46.1 mg/dL (ref >39.00)
LDL CALC: 83 mg/dL (ref 0–99)
NonHDL: 95.25
TRIGLYCERIDES: 60 mg/dL (ref 0.0–149.0)
Total CHOL/HDL Ratio: 3
VLDL: 12 mg/dL (ref 0.0–40.0)

## 2017-09-13 LAB — HEMOGLOBIN A1C: Hgb A1c MFr Bld: 6.2 % (ref 4.6–6.5)

## 2017-09-13 LAB — TSH: TSH: 1.51 u[IU]/mL (ref 0.35–4.50)

## 2017-09-13 NOTE — Patient Instructions (Signed)

## 2017-09-13 NOTE — Progress Notes (Signed)
Office Note 09/13/2017  CC:  Chief Complaint  Patient presents with  . Annual Exam    Pt is fasting.    HPI:  Joshua Page is a 42 y.o. White male who is here for annual health maintenance exam. He finished up counseling with Dr. Dewayne Hatch last week. No anxiety issues lately at all, consistently taking citalopram. He does not have to take the lorazepam, but he does keep it in his travel bag in case he flies. Even his fear of flying is better, though.  Exercise: not exercising.  Active and busy with work, got off his exercise routine. Diet: says he needs to work on this as well. Dental: q32mo visit. Eye: w/in the last 1 yr--contacts.   Past Medical History:  Diagnosis Date  . Adjustment disorder with anxiety   . BPH with obstruction/lower urinary tract symptoms 2018   Pt declined bph med as of 06/2016  . Cutaneous abscess of face 04/2017  . Disequilibrium syndrome   . Elevated blood pressure reading without diagnosis of hypertension   . Elevated transaminase level 09/10/2016   [facility] wellness labs--AST 41, ALT 76.  Marland Kitchen Hay fever   . Hepatic steatosis    u/s 09/2016  . Prediabetes    via work labs 2017/18; A1c 6.2% here 05/2016.  Improved to 5.7% 08/2016.  Then, A1c via [facility] on 09/10/16 was 6.1%.    Past Surgical History:  Procedure Laterality Date  . HOLTER MONITOR  09/2016   NSR with occ sinus brady, rare PVC.    Family History  Problem Relation Age of Onset  . Diabetes Father   . Alcohol abuse Father   . Mental illness Father        Bipolar + schizophrenia  . Diabetes Maternal Grandmother   . Pancreatic cancer Maternal Grandfather     Social History   Socioeconomic History  . Marital status: Married    Spouse name: Not on file  . Number of children: Not on file  . Years of education: Not on file  . Highest education level: Not on file  Occupational History  . Not on file  Social Needs  . Financial resource strain: Not on file  . Food insecurity:   Worry: Not on file    Inability: Not on file  . Transportation needs:    Medical: Not on file    Non-medical: Not on file  Tobacco Use  . Smoking status: Former Smoker    Packs/day: 0.25    Years: 15.00    Pack years: 3.75    Types: Cigarettes    Last attempt to quit: 02/15/2013    Years since quitting: 4.5  . Smokeless tobacco: Former Neurosurgeon    Types: Chew    Quit date: 02/15/2014  Substance and Sexual Activity  . Alcohol use: No  . Drug use: No  . Sexual activity: Not on file  Lifestyle  . Physical activity:    Days per week: Not on file    Minutes per session: Not on file  . Stress: Not on file  Relationships  . Social connections:    Talks on phone: Not on file    Gets together: Not on file    Attends religious service: Not on file    Active member of club or organization: Not on file    Attends meetings of clubs or organizations: Not on file    Relationship status: Not on file  . Intimate partner violence:    Fear of  current or ex partner: Not on file    Emotionally abused: Not on file    Physically abused: Not on file    Forced sexual activity: Not on file  Other Topics Concern  . Not on file  Social History Narrative   Married, 5 children.     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   No tobacco as of 2016.  5 pack-yr hx prior.  Hx of dipping snuff as well.    Outpatient Medications Prior to Visit  Medication Sig Dispense Refill  . ALBUTEROL IN Inhale into the lungs.    Marland Kitchen. aspirin 81 MG chewable tablet Chew 1 tablet (81 mg total) by mouth daily. 30 tablet 1  . citalopram (CELEXA) 20 MG tablet Take 1 tablet (20 mg total) by mouth daily. 5 tablet 0  . LORazepam (ATIVAN) 0.5 MG tablet 1-2 tabs po bid prn anxiety/stress 30 tablet 1  . mupirocin ointment (BACTROBAN) 2 % Apply 1 application topically 3 (three) times daily. (Patient not taking: Reported on 09/13/2017) 15 g 0   No  facility-administered medications prior to visit.     No Known Allergies  ROS Review of Systems  Constitutional: Negative for appetite change, chills, fatigue and fever.  HENT: Negative for congestion, dental problem, ear pain and sore throat.   Eyes: Negative for discharge, redness and visual disturbance.  Respiratory: Negative for cough, chest tightness, shortness of breath and wheezing.   Cardiovascular: Negative for chest pain, palpitations and leg swelling.  Gastrointestinal: Negative for abdominal pain, blood in stool, diarrhea, nausea and vomiting.  Genitourinary: Negative for difficulty urinating, dysuria, flank pain, frequency, hematuria and urgency.  Musculoskeletal: Positive for arthralgias (bilat shoulders, mild impingement/RC bursitis sx's described). Negative for back pain, joint swelling, myalgias and neck stiffness.  Skin: Negative for pallor and rash.  Neurological: Negative for dizziness, speech difficulty, weakness and headaches.  Hematological: Negative for adenopathy. Does not bruise/bleed easily.  Psychiatric/Behavioral: Negative for confusion and sleep disturbance. The patient is not nervous/anxious.     PE; Blood pressure 110/72, pulse (!) 57, temperature 97.8 F (36.6 C), temperature source Oral, resp. rate 16, height 5\' 11"  (1.803 m), weight 221 lb 4 oz (100.4 kg), SpO2 97 %. Gen: Alert, well appearing.  Patient is oriented to person, place, time, and situation. AFFECT: pleasant, lucid thought and speech. ENT: Ears: EACs clear, normal epithelium.  TMs with good light reflex and landmarks bilaterally.  Eyes: no injection, icteris, swelling, or exudate.  EOMI, PERRLA. Nose: no drainage or turbinate edema/swelling.  No injection or focal lesion.  Mouth: lips without lesion/swelling.  Oral mucosa pink and moist.  Dentition intact and without obvious caries or gingival swelling.  Oropharynx without erythema, exudate, or swelling.  Neck: supple/nontender.  No LAD,  mass, or TM.  Carotid pulses 2+ bilaterally, without bruits. CV: RRR, no m/r/g.   LUNGS: CTA bilat, nonlabored resps, good aeration in all lung fields. ABD: soft, NT, ND, BS normal.  No hepatospenomegaly or mass.  No bruits. EXT: no clubbing, cyanosis, or edema.  Musculoskeletal: no joint swelling, erythema, warmth, or tenderness.  ROM of all joints intact. Skin - no sores or suspicious lesions or rashes or color changes   Pertinent labs:  Lab Results  Component Value Date   TSH 1.71 06/11/2016   Lab Results  Component Value Date   WBC 5.4 09/10/2016   HGB 15.7 09/10/2016   HCT 46  09/10/2016   MCV 88.8 07/22/2016   PLT 240 09/10/2016   Lab Results  Component Value Date   CREATININE 0.9 09/10/2016   BUN 13 09/10/2016   NA 141 09/10/2016   K 4.2 09/10/2016   CL 100 (L) 07/22/2016   CO2 29 07/22/2016   Lab Results  Component Value Date   ALT 47 12/31/2016   AST 27 12/31/2016   ALKPHOS 48 12/31/2016   BILITOT 0.6 12/31/2016   Lab Results  Component Value Date   CHOL 142 09/10/2016   Lab Results  Component Value Date   HDL 43 09/10/2016   Lab Results  Component Value Date   LDLCALC 80 09/10/2016   Lab Results  Component Value Date   TRIG 93 09/10/2016   Lab Results  Component Value Date   CHOLHDL 4 06/11/2016   Lab Results  Component Value Date   HGBA1C 6.0 12/31/2016    ASSESSMENT AND PLAN:   Health maintenance exam: Reviewed age and gender appropriate health maintenance issues (prudent diet, regular exercise, health risks of tobacco and excessive alcohol, use of seatbelts, fire alarms in home, use of sunscreen).  Also reviewed age and gender appropriate health screening as well as vaccine recommendations. Vaccines: UTD. Labs: fasting HP (hx of elev bp w/out dx of HTN, hx of elev LFT, hepatic steatosis) + HbA1c (prediabetes), and HIV screen. Prostate ca screening: average risk patient= start screening at age 46 yrs. Colon ca screening: average risk  patient= start screening at age 17 yrs.  Regarding his GAD: he has been on citalopram x 3 mo or so and is doing wonderfully.  Plan to keep him on this at least 6 more months and then consider ween off of this med.  An After Visit Summary was printed and given to the patient.  FOLLOW UP:  Return in about 6 months (around 03/16/2018) for f/u GAD/med.  Signed:  Santiago Bumpers, MD           09/13/2017

## 2017-09-14 ENCOUNTER — Telehealth: Payer: Self-pay | Admitting: *Deleted

## 2017-09-14 ENCOUNTER — Encounter: Payer: Self-pay | Admitting: Family Medicine

## 2017-09-14 LAB — HIV ANTIBODY (ROUTINE TESTING W REFLEX): HIV: NONREACTIVE

## 2017-09-14 NOTE — Telephone Encounter (Signed)
Health Form pt dropped off on 09/13/17 has been filled out and put on Dr. Samul DadaMcGowen's desk for review/completion.

## 2017-09-15 ENCOUNTER — Encounter: Payer: Self-pay | Admitting: *Deleted

## 2017-09-15 NOTE — Telephone Encounter (Signed)
Noted  

## 2017-09-15 NOTE — Telephone Encounter (Signed)
Health Form completed, copy made for chart and original put up front for p/u.   MyChart message sent.  MyChart message read.

## 2017-11-04 DIAGNOSIS — Z23 Encounter for immunization: Secondary | ICD-10-CM | POA: Diagnosis not present

## 2017-11-12 IMAGING — CR DG CHEST 2V
2 series · 2 of 2 positions shown · non-contrast
Comparison: None

CLINICAL DATA: C/o mid chest pain radiating down Rt arm/leg w/SOB
today. Also states dry cough x 2 days

EXAM:
CHEST  2 VIEW

[w chest pa]
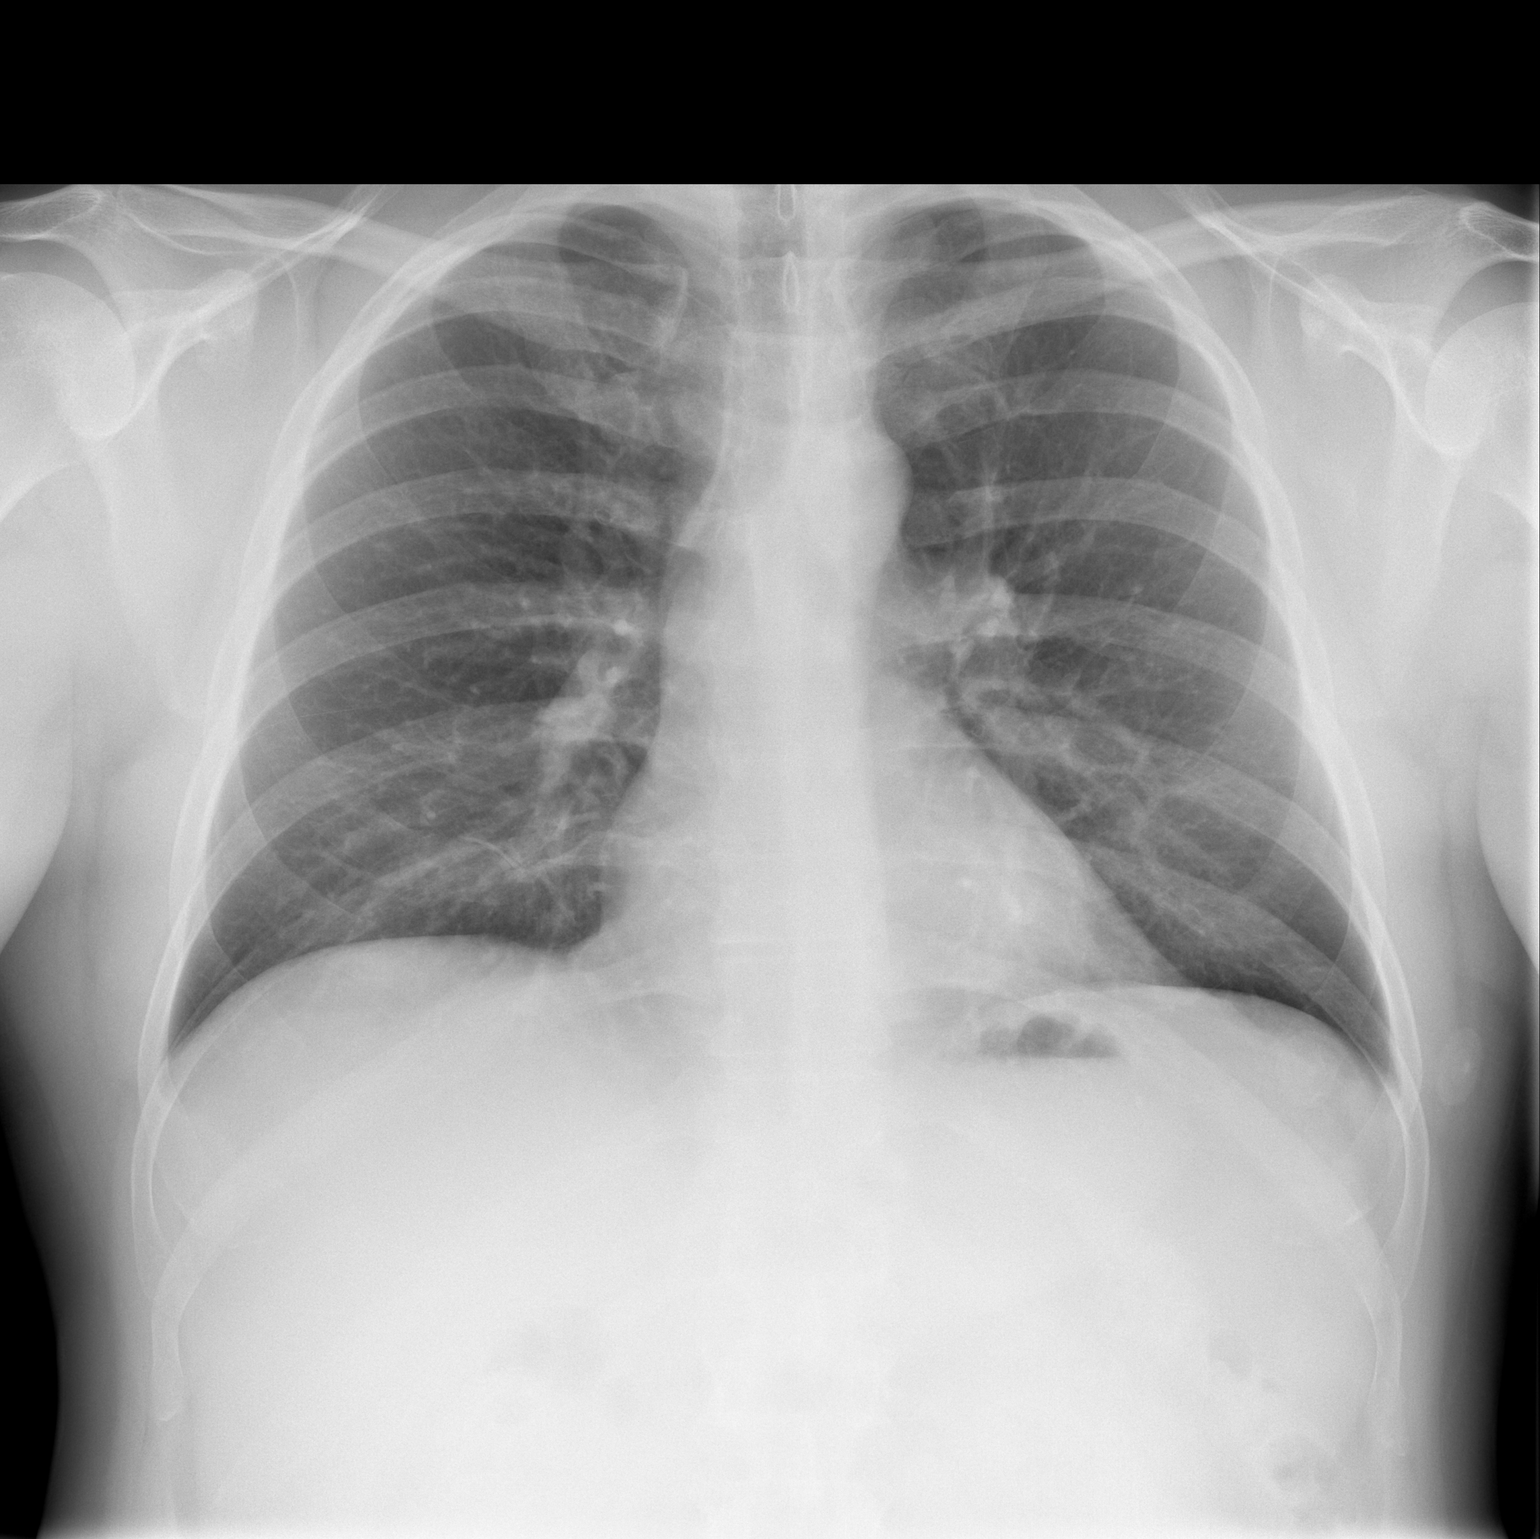

[w chest lat]
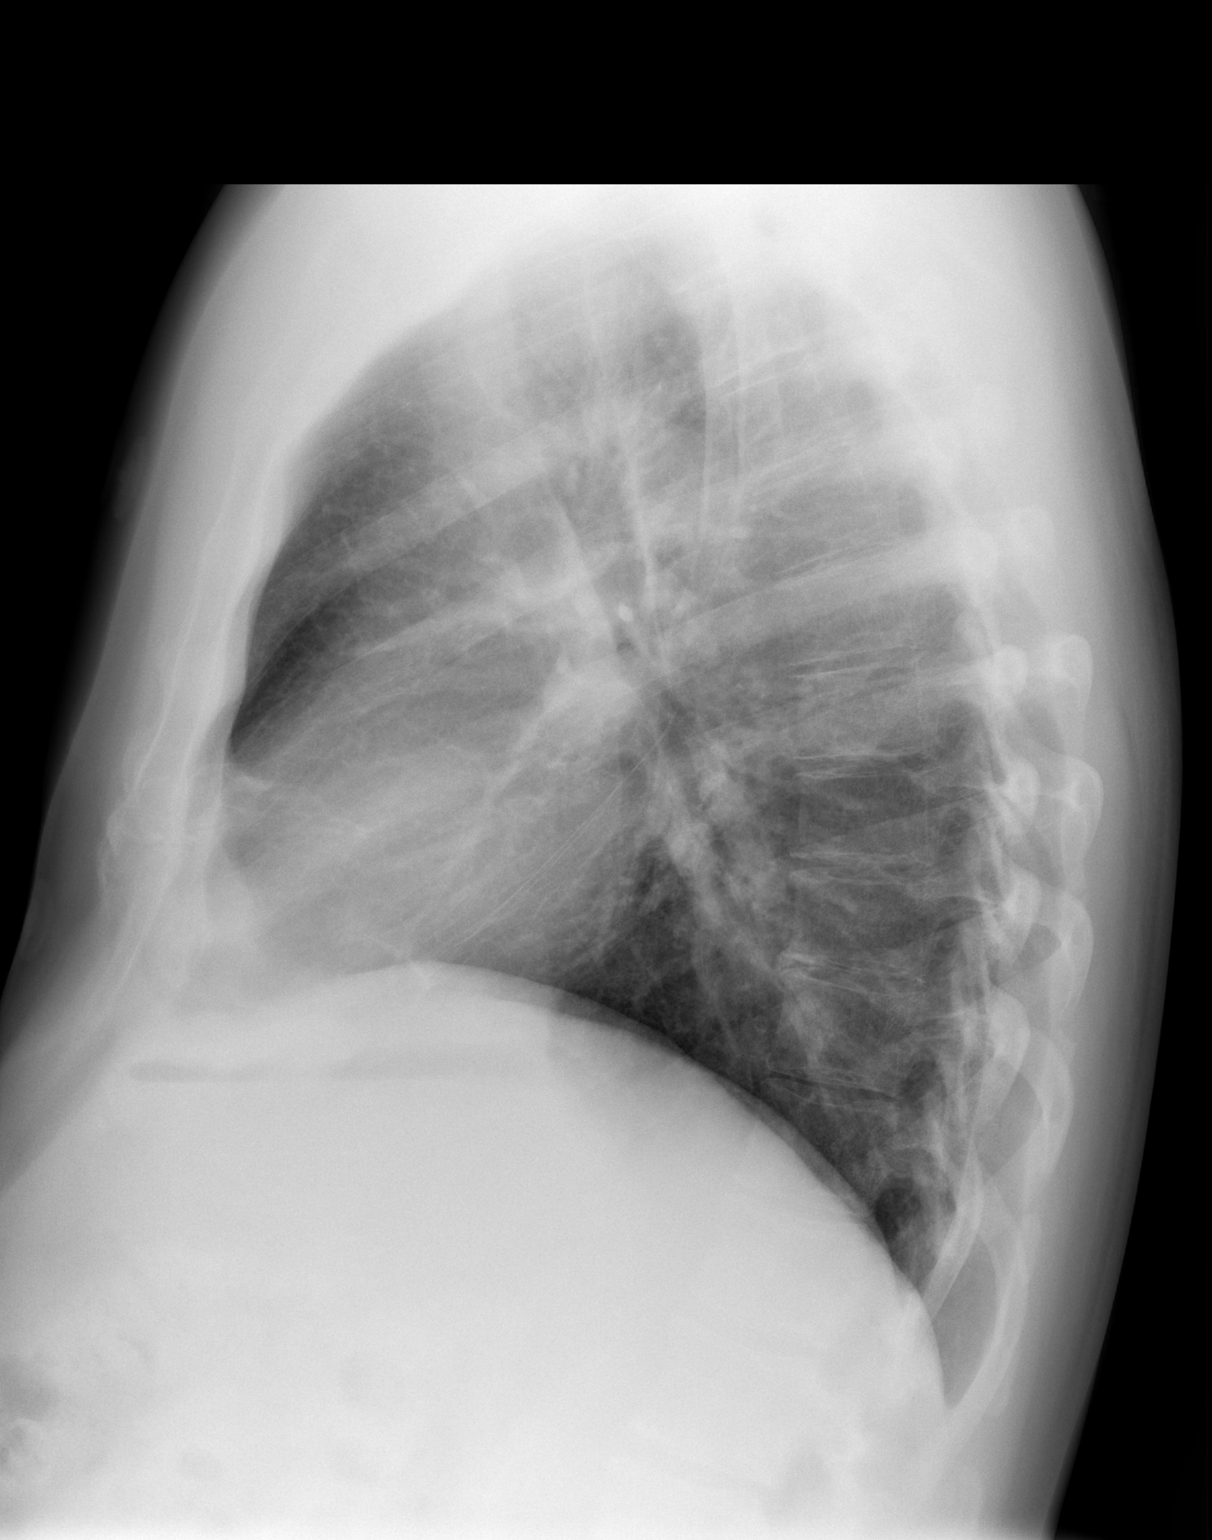

[2 of 2 positions shown; findings below may reference images not displayed]

FINDINGS: Normal mediastinum and cardiac silhouette. Normal pulmonary
vasculature. No evidence of effusion, infiltrate, or pneumothorax.
No acute bony abnormality.
IMPRESSION: No acute cardiopulmonary process.

## 2018-03-01 ENCOUNTER — Other Ambulatory Visit: Payer: Self-pay | Admitting: Family Medicine

## 2018-03-20 ENCOUNTER — Ambulatory Visit: Payer: BLUE CROSS/BLUE SHIELD | Admitting: Family Medicine

## 2018-03-20 ENCOUNTER — Encounter: Payer: Self-pay | Admitting: Family Medicine

## 2018-03-20 VITALS — BP 113/76 | HR 62 | Temp 98.3°F | Resp 16 | Ht 71.0 in | Wt 222.2 lb

## 2018-03-20 DIAGNOSIS — R7303 Prediabetes: Secondary | ICD-10-CM | POA: Diagnosis not present

## 2018-03-20 DIAGNOSIS — F411 Generalized anxiety disorder: Secondary | ICD-10-CM | POA: Diagnosis not present

## 2018-03-20 LAB — POCT GLYCOSYLATED HEMOGLOBIN (HGB A1C): HEMOGLOBIN A1C: 5.7 % — AB (ref 4.0–5.6)

## 2018-03-20 MED ORDER — LORAZEPAM 0.5 MG PO TABS: ORAL_TABLET | ORAL | 1 refills | Status: DC

## 2018-03-20 NOTE — Progress Notes (Signed)
OFFICE VISIT  03/20/2018   CC:  Chief Complaint  Patient presents with  . Follow-up    GAD   HPI:    Patient is a 43 y.o. Caucasian male who presents for 6 mo f/u GAD and prediabetes.  At last o/v his health panel labs were all normal except his glucose was 113 and his Hba1c was 6.2%.  TLC was recommended.  GAD: as of last f/u he was doing well on citalopram (has been on this med x 6 mo now), not having to use lorazepam. Update: doing well.  Has only had to use lorazepam twice when mind was running with nervousness regarding a work progress.    Prediabetes: has not made any dietary of exercise changes---admits he really needs to get on top of these things. Has 5 kids and is busy with work.  Wt is stable.  Past Medical History:  Diagnosis Date  . BPH with obstruction/lower urinary tract symptoms 2018   Pt declined bph med as of 06/2016  . Cutaneous abscess of face 04/2017  . Disequilibrium syndrome   . Elevated blood pressure reading without diagnosis of hypertension   . Elevated transaminase level 09/10/2016   [facility] wellness labs--AST 41, ALT 76.   Viral Hep screening NEG.  . GAD (generalized anxiety disorder)   . Hay fever   . Hepatic steatosis    u/s 09/2016  . Prediabetes    via work labs 2017/18; A1c 6.2% here 05/2016.  Improved to 5.7% 08/2016.  Then, A1c via [facility] on 09/10/16 was 6.1%.  A1c 6.2% July 2019-->TLC.    Past Surgical History:  Procedure Laterality Date  . HOLTER MONITOR  09/2016   NSR with occ sinus brady, rare PVC.    Outpatient Medications Prior to Visit  Medication Sig Dispense Refill  . ALBUTEROL IN Inhale into the lungs.    Marland Kitchen. aspirin 81 MG chewable tablet Chew 1 tablet (81 mg total) by mouth daily. 30 tablet 1  . citalopram (CELEXA) 20 MG tablet TAKE 1 TABLET BY MOUTH EVERY DAY 90 tablet 0  . LORazepam (ATIVAN) 0.5 MG tablet 1-2 tabs po bid prn anxiety/stress 30 tablet 1   No facility-administered medications prior to visit.     No Known  Allergies  ROS As per HPI  PE: Blood pressure 113/76, pulse 62, temperature 98.3 F (36.8 C), temperature source Oral, resp. rate 16, height 5\' 11"  (1.803 m), weight 222 lb 4 oz (100.8 kg), SpO2 97 %. Body mass index is 31 kg/m.  Gen: Alert, well appearing.  Patient is oriented to person, place, time, and situation. AFFECT: pleasant, lucid thought and speech. No further exam today.  LABS:  Lab Results  Component Value Date   TSH 1.51 09/13/2017   Lab Results  Component Value Date   WBC 6.0 09/13/2017   HGB 15.1 09/13/2017   HCT 44.7 09/13/2017   MCV 89.7 09/13/2017   PLT 250.0 09/13/2017   Lab Results  Component Value Date   CREATININE 0.80 09/13/2017   BUN 14 09/13/2017   NA 139 09/13/2017   K 4.0 09/13/2017   CL 104 09/13/2017   CO2 29 09/13/2017   Lab Results  Component Value Date   ALT 55 (H) 09/13/2017   AST 29 09/13/2017   ALKPHOS 47 09/13/2017   BILITOT 0.6 09/13/2017   Lab Results  Component Value Date   CHOL 141 09/13/2017   Lab Results  Component Value Date   HDL 46.10 09/13/2017   Lab  Results  Component Value Date   LDLCALC 83 09/13/2017   Lab Results  Component Value Date   TRIG 60.0 09/13/2017   Lab Results  Component Value Date   CHOLHDL 3 09/13/2017   Lab Results  Component Value Date   HGBA1C 5.7 (A) 03/20/2018   POC A1c today= 5.7%  IMPRESSION AND PLAN:  1) GAD: The current medical regimen is effective;  continue present plan and medications. Continue citalopram at least 6 more months and possibly ween off at that time. He uses lorazepam VERY infrequently (last fill was 04/21/17).  RF for #30 with RF x 1 today.  2) prediabetes: needs to work on TLC. POC HbA1c today= 5.7%. Encouraged pt to still continue to make TLC changes and work on losing 15 lbs or so.  An After Visit Summary was printed and given to the patient.  FOLLOW UP: Return in about 6 months (around 09/18/2018) for routine chronic illness f/u.  Signed:  Santiago Bumpers, MD           03/20/2018

## 2018-05-07 IMAGING — US US ABDOMEN LIMITED
1 series · 14 of 25 positions shown · non-contrast
Comparison: None.

CLINICAL DATA: Elevated LFTs

EXAM:
ULTRASOUND ABDOMEN LIMITED RIGHT UPPER QUADRANT

[Series 1: us abdomen limited · 0.20mm/px · 14 of 45 slices shown]
[im 1/45]
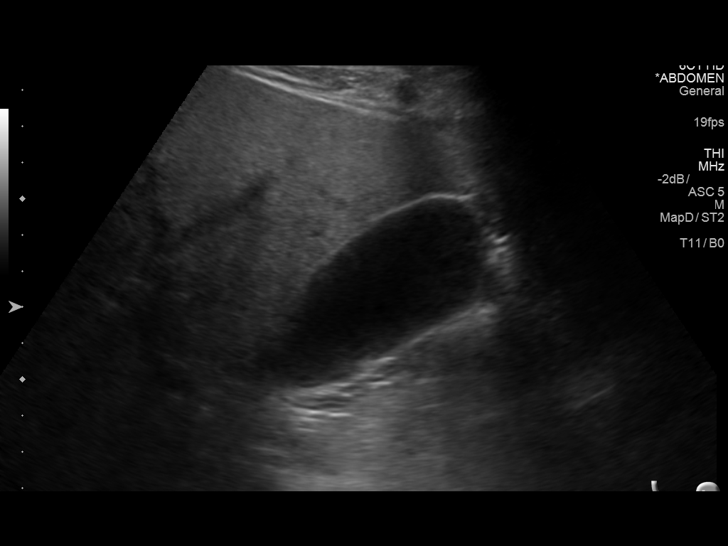
[im 4/45]
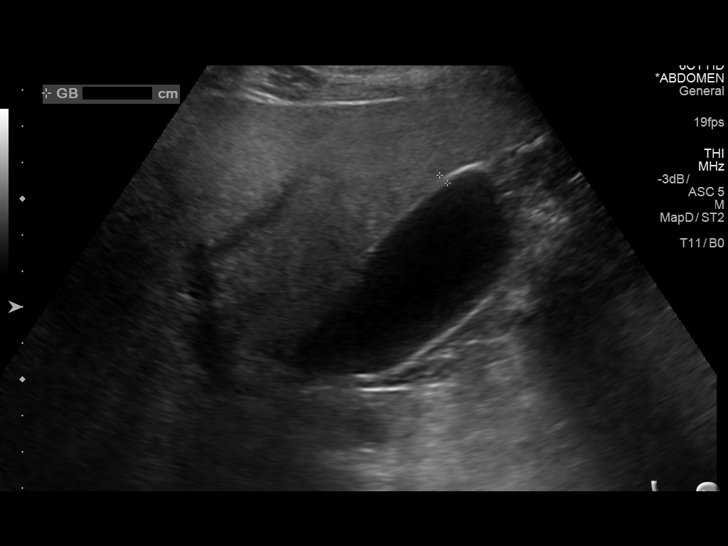
[im 8/45]
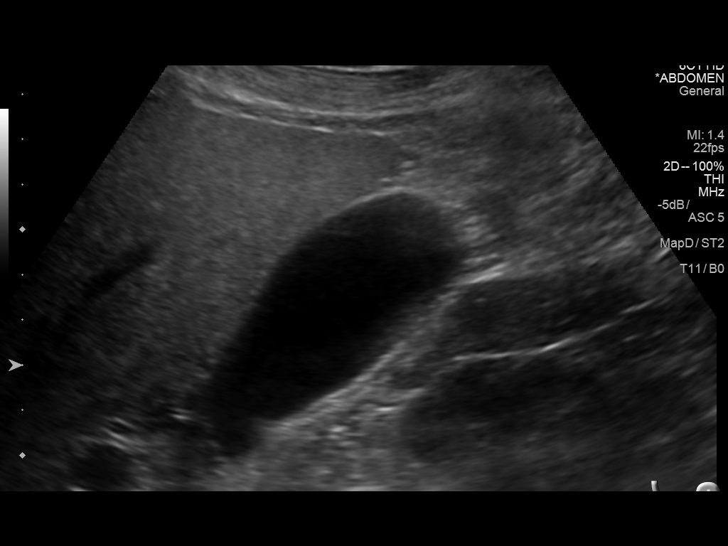
[im 12/45]
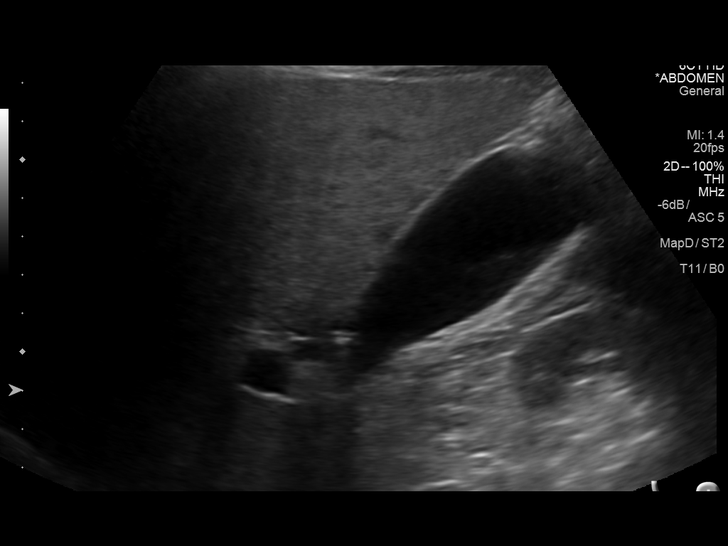
[im 15/45]
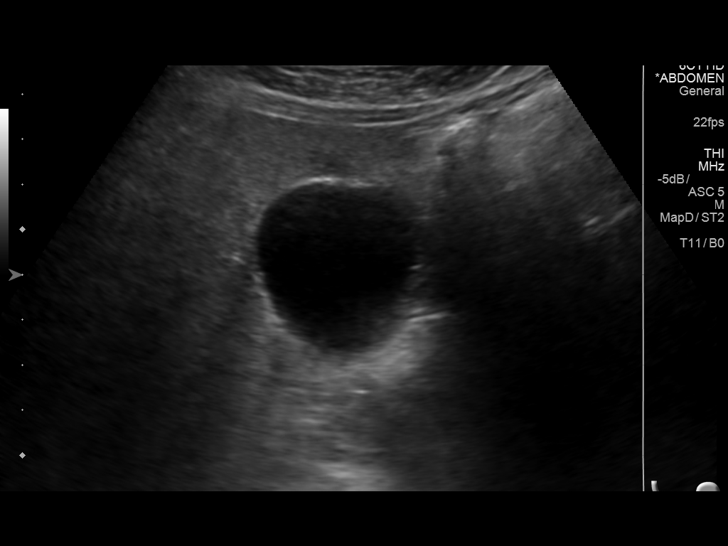
[im 17/45]
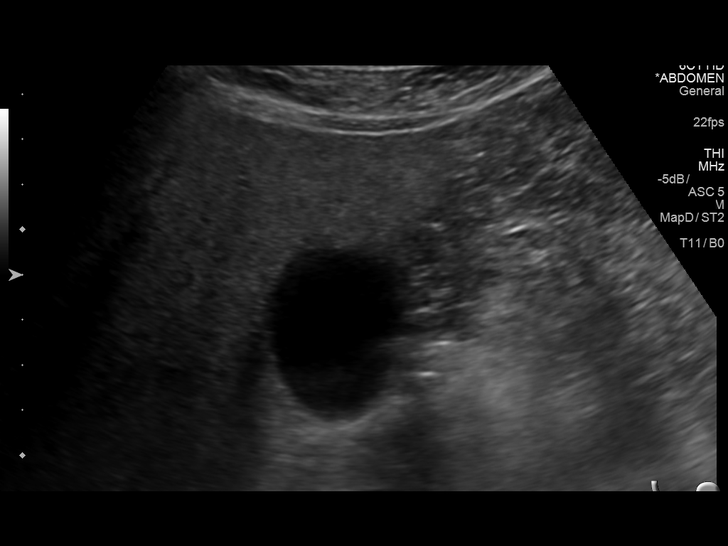
[im 21/45]
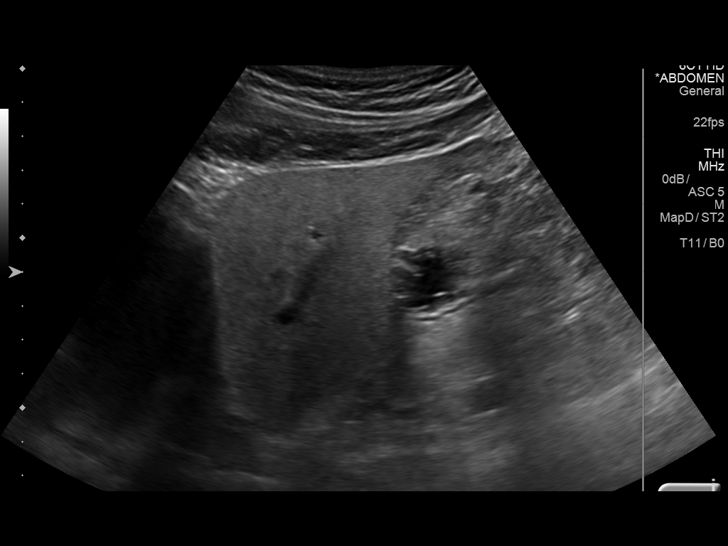
[im 24/45]
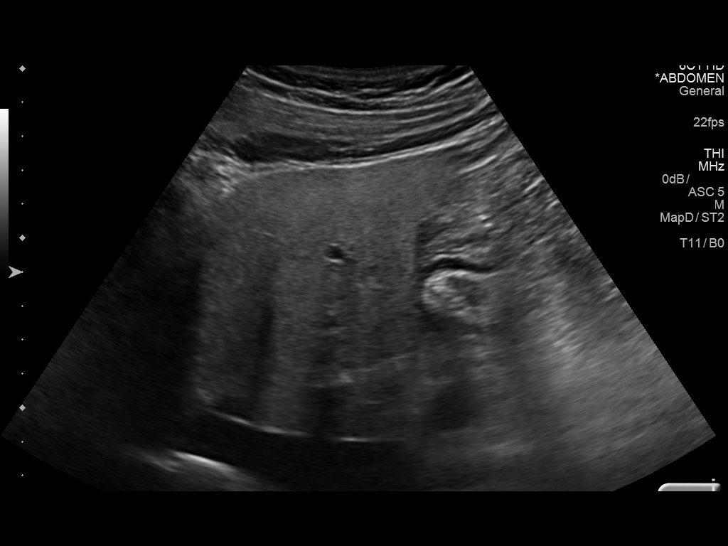
[im 28/45]
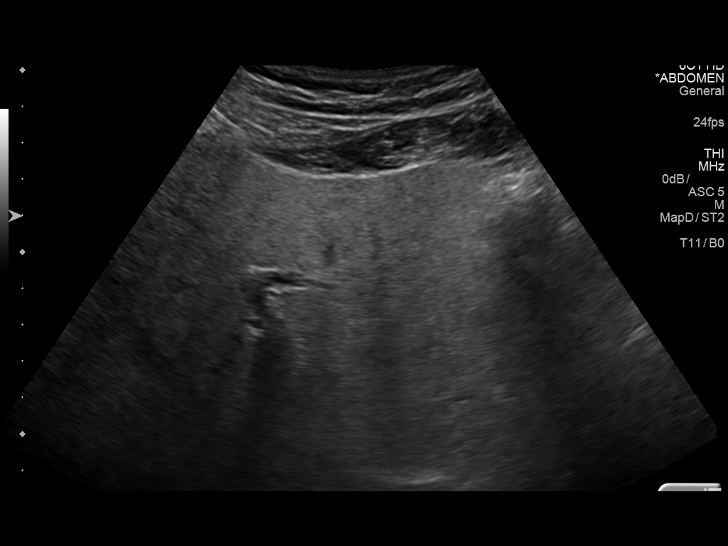
[im 30/45]
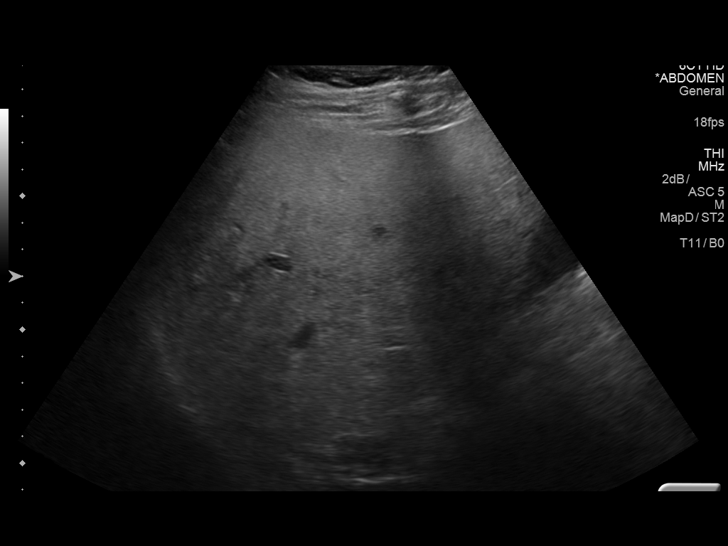
[im 34/45]
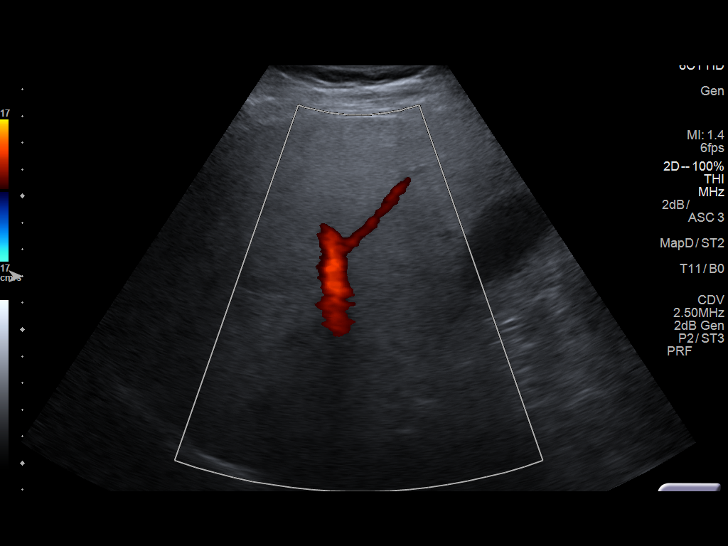
[im 37/45]
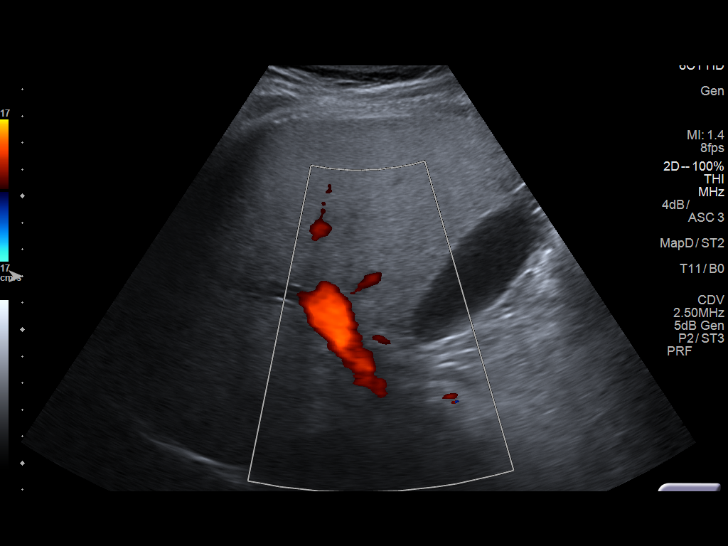
[im 41/45]
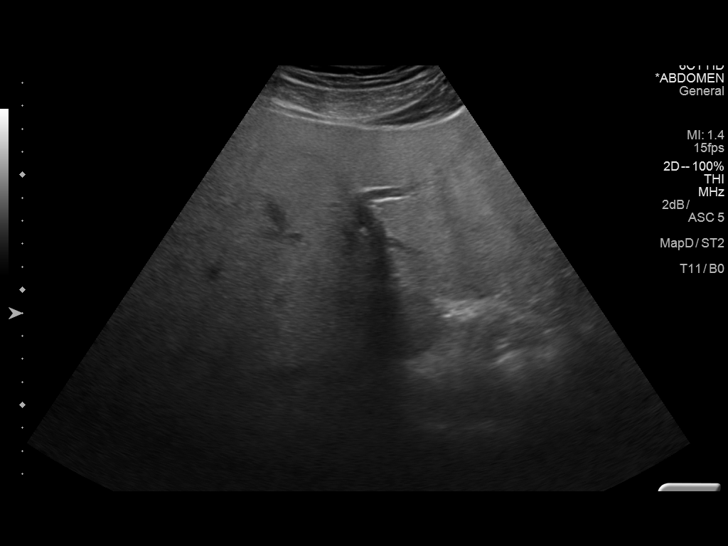
[im 45/45]
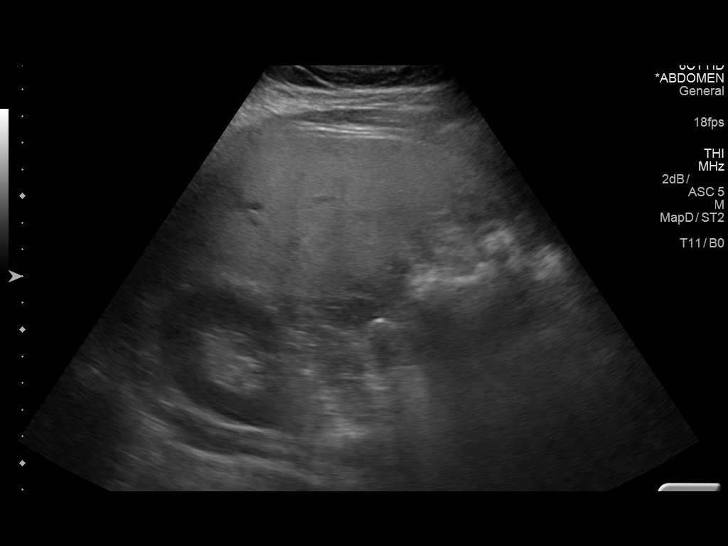

[14 of 25 positions shown; findings below may reference images not displayed]

FINDINGS: Gallbladder:

No gallstones or wall thickening visualized. No sonographic Murphy
sign noted by sonographer.

Common bile duct:

Diameter: Normal caliber, 4 mm

Liver:

Increased echotexture compatible with fatty infiltration. No focal
abnormality or biliary ductal dilatation. Portal vein is patent on
color Doppler imaging with normal direction of blood flow towards
the liver.
IMPRESSION: Fatty infiltration of the liver.

## 2018-06-05 ENCOUNTER — Other Ambulatory Visit: Payer: Self-pay

## 2018-06-05 MED ORDER — CITALOPRAM HYDROBROMIDE 20 MG PO TABS: 20.0000 mg | ORAL_TABLET | Freq: Every day | ORAL | 0 refills | Status: DC

## 2018-09-01 ENCOUNTER — Other Ambulatory Visit: Payer: Self-pay | Admitting: Family Medicine

## 2018-11-27 DIAGNOSIS — Z23 Encounter for immunization: Secondary | ICD-10-CM | POA: Diagnosis not present

## 2018-11-28 ENCOUNTER — Other Ambulatory Visit: Payer: Self-pay | Admitting: Family Medicine

## 2019-01-02 ENCOUNTER — Other Ambulatory Visit: Payer: Self-pay | Admitting: Family Medicine

## 2019-01-17 ENCOUNTER — Other Ambulatory Visit: Payer: Self-pay

## 2019-01-17 ENCOUNTER — Ambulatory Visit (INDEPENDENT_AMBULATORY_CARE_PROVIDER_SITE_OTHER): Payer: BC Managed Care – PPO | Admitting: Family Medicine

## 2019-01-17 ENCOUNTER — Encounter: Payer: Self-pay | Admitting: Family Medicine

## 2019-01-17 VITALS — BP 123/79 | HR 65 | Temp 98.4°F | Resp 16 | Ht 71.0 in | Wt 227.0 lb

## 2019-01-17 DIAGNOSIS — R7303 Prediabetes: Secondary | ICD-10-CM

## 2019-01-17 DIAGNOSIS — F411 Generalized anxiety disorder: Secondary | ICD-10-CM

## 2019-01-17 MED ORDER — CITALOPRAM HYDROBROMIDE 20 MG PO TABS: 20.0000 mg | ORAL_TABLET | Freq: Every day | ORAL | 3 refills | Status: DC

## 2019-01-17 MED ORDER — LORAZEPAM 0.5 MG PO TABS: ORAL_TABLET | ORAL | 1 refills | Status: DC

## 2019-01-17 NOTE — Progress Notes (Signed)
OFFICE VISIT  01/17/2019   CC:  Chief Complaint  Patient presents with  . Follow-up    RCI, pt is fasting   HPI:    Patient is a 43 y.o. Caucasian male who presents for 9 mo f/u GAD and prediabetes.  A/P as of last visit: "1) GAD: The current medical regimen is effective;  continue present plan and medications. Continue citalopram at least 6 more months and possibly ween off at that time. He uses lorazepam VERY infrequently (last fill was 04/21/17).  RF for #30 with RF x 1 today.  2) prediabetes: needs to work on [place]. POC HbA1c today= 5.7%. Encouraged pt to still continue to make TLC changes and work on losing 15 lbs or so."  Interim hx: Feeling well. Anx/mood stable despite pandemic changes in life + wife getting dx'd with brain tumor since I last saw him. Fortunately it is benign acoustic schwannoma, was resected.  Some facial nerve damage gradually improving--all good considering.  Has required occasional lorazepam and finds this helpful on minimal/prn basis. Walking daily 30 min minimum daily, still working on good dietary changes and admits he has a ways to go. No acute complaints or new questions. Needs RF of citalopram and lorazepam today.   PMP AWARE reviewed today: most recent lorazepam rx filled 06/10/18, #30, rx by me, no red flags.    Past Medical History:  Diagnosis Date  . BPH with obstruction/lower urinary tract symptoms 2018   Pt declined bph med as of 06/2016  . Cutaneous abscess of face 04/2017  . Disequilibrium syndrome   . Elevated blood pressure reading without diagnosis of hypertension   . Elevated transaminase level 09/10/2016   [facility] wellness labs--AST 41, ALT 76.   Viral Hep screening NEG.  . GAD (generalized anxiety disorder)   . Hay fever   . Hepatic steatosis    u/s 09/2016  . Prediabetes    via work labs 2017/18; A1c 6.2% here 05/2016.  Improved to 5.7% 08/2016.  Then, A1c via [facility] on 09/10/16 was 6.1%.  A1c 6.2% July 2019-->TLC.    Past  Surgical History:  Procedure Laterality Date  . HOLTER MONITOR  09/2016   NSR with occ sinus brady, rare PVC.   Family History  Problem Relation Age of Onset  . Diabetes Father   . Alcohol abuse Father   . Mental illness Father        Bipolar + schizophrenia  . Diabetes Maternal Grandmother   . Pancreatic cancer Maternal Grandfather     Outpatient Medications Prior to Visit  Medication Sig Dispense Refill  . aspirin 81 MG chewable tablet Chew 1 tablet (81 mg total) by mouth daily. 30 tablet 1  . citalopram (CELEXA) 20 MG tablet Take 1 tablet (20 mg total) by mouth daily. OFFICE VISIT NEEDED 30 tablet 0  . LORazepam (ATIVAN) 0.5 MG tablet 1-2 tabs po bid prn anxiety/stress 30 tablet 1  . ALBUTEROL IN Inhale into the lungs.     No facility-administered medications prior to visit.     No Known Allergies  ROS As per HPI  PE: Blood pressure 123/79, pulse 65, temperature 98.4 F (36.9 C), temperature source Temporal, resp. rate 16, height 5\' 11"  (1.803 m), weight 227 lb (103 kg), SpO2 96 %. Body mass index is 31.66 kg/m.  Gen: Alert, well appearing.  Patient is oriented to person, place, time, and situation. AFFECT: pleasant, lucid thought and speech. No further exam today.  LABS:  Lab Results  Component Value Date   TSH 1.51 09/13/2017   Lab Results  Component Value Date   WBC 6.0 09/13/2017   HGB 15.1 09/13/2017   HCT 44.7 09/13/2017   MCV 89.7 09/13/2017   PLT 250.0 09/13/2017   Lab Results  Component Value Date   CREATININE 0.80 09/13/2017   BUN 14 09/13/2017   NA 139 09/13/2017   K 4.0 09/13/2017   CL 104 09/13/2017   CO2 29 09/13/2017   Lab Results  Component Value Date   ALT 55 (H) 09/13/2017   AST 29 09/13/2017   ALKPHOS 47 09/13/2017   BILITOT 0.6 09/13/2017   Lab Results  Component Value Date   CHOL 141 09/13/2017   Lab Results  Component Value Date   HDL 46.10 09/13/2017   Lab Results  Component Value Date   LDLCALC 83 09/13/2017    Lab Results  Component Value Date   TRIG 60.0 09/13/2017   Lab Results  Component Value Date   CHOLHDL 3 09/13/2017   Lab Results  Component Value Date   HGBA1C 5.7 (A) 03/20/2018    IMPRESSION AND PLAN:  GAD with hx of panic: doing great. Continue citalopram 20mg  qd and lorazepam 0.5mg  prn. Controlled substance contract reviewed with patient today.  Patient signed this and it will be placed in the chart.   Rx'd lorazepam 0.5mg , 1-2 bid prn, #30, RF x 1 today. RF'd citalopram as well.  2) Prediabetes: doing pretty good with TLC's. Check BMET and HbA1c today.  An After Visit Summary was printed and given to the patient.  FOLLOW UP: Return in about 6 months (around 07/18/2019) for annual CPE (fasting).  Signed:  09/17/2019, MD           01/17/2019

## 2019-01-18 LAB — BASIC METABOLIC PANEL
BUN: 9 mg/dL (ref 6–23)
CO2: 30 mEq/L (ref 19–32)
Calcium: 9.5 mg/dL (ref 8.4–10.5)
Chloride: 103 mEq/L (ref 96–112)
Creatinine, Ser: 0.83 mg/dL (ref 0.40–1.50)
GFR: 101.64 mL/min (ref >60.00)
Glucose, Bld: 77 mg/dL (ref 70–99)
Potassium: 3.8 mEq/L (ref 3.5–5.1)
Sodium: 141 mEq/L (ref 135–145)

## 2019-01-18 LAB — HEMOGLOBIN A1C: Hgb A1c MFr Bld: 6.1 % (ref 4.6–6.5)

## 2019-07-05 DIAGNOSIS — J309 Allergic rhinitis, unspecified: Secondary | ICD-10-CM | POA: Diagnosis not present

## 2019-07-05 DIAGNOSIS — S161XXA Strain of muscle, fascia and tendon at neck level, initial encounter: Secondary | ICD-10-CM | POA: Diagnosis not present

## 2019-07-18 ENCOUNTER — Encounter: Payer: BC Managed Care – PPO | Admitting: Family Medicine

## 2019-07-24 ENCOUNTER — Encounter: Payer: BC Managed Care – PPO | Admitting: Family Medicine

## 2019-08-07 ENCOUNTER — Ambulatory Visit (INDEPENDENT_AMBULATORY_CARE_PROVIDER_SITE_OTHER): Payer: BC Managed Care – PPO | Admitting: Family Medicine

## 2019-08-07 ENCOUNTER — Encounter: Payer: Self-pay | Admitting: Family Medicine

## 2019-08-07 ENCOUNTER — Other Ambulatory Visit: Payer: Self-pay

## 2019-08-07 VITALS — BP 102/80 | HR 63 | Temp 98.0°F | Ht 71.0 in | Wt 224.0 lb

## 2019-08-07 DIAGNOSIS — R7303 Prediabetes: Secondary | ICD-10-CM | POA: Diagnosis not present

## 2019-08-07 DIAGNOSIS — K760 Fatty (change of) liver, not elsewhere classified: Secondary | ICD-10-CM | POA: Diagnosis not present

## 2019-08-07 DIAGNOSIS — Z0000 Encounter for general adult medical examination without abnormal findings: Secondary | ICD-10-CM

## 2019-08-07 DIAGNOSIS — R030 Elevated blood-pressure reading, without diagnosis of hypertension: Secondary | ICD-10-CM | POA: Diagnosis not present

## 2019-08-07 LAB — CBC WITH DIFFERENTIAL/PLATELET
Basophils Absolute: 0.1 10*3/uL (ref 0.0–0.1)
Basophils Relative: 1.8 % (ref 0.0–3.0)
Eosinophils Absolute: 0.2 10*3/uL (ref 0.0–0.7)
Eosinophils Relative: 2.6 % (ref 0.0–5.0)
HCT: 45.2 % (ref 39.0–52.0)
Hemoglobin: 15.4 g/dL (ref 13.0–17.0)
Lymphocytes Relative: 32.2 % (ref 12.0–46.0)
Lymphs Abs: 2.1 10*3/uL (ref 0.7–4.0)
MCHC: 34.1 g/dL (ref 30.0–36.0)
MCV: 89 fl (ref 78.0–100.0)
Monocytes Absolute: 0.5 10*3/uL (ref 0.1–1.0)
Monocytes Relative: 8 % (ref 3.0–12.0)
Neutro Abs: 3.6 10*3/uL (ref 1.4–7.7)
Neutrophils Relative %: 55.4 % (ref 43.0–77.0)
Platelets: 263 10*3/uL (ref 150.0–400.0)
RBC: 5.08 Mil/uL (ref 4.22–5.81)
RDW: 13.1 % (ref 11.5–15.5)
WBC: 6.5 10*3/uL (ref 4.0–10.5)

## 2019-08-07 LAB — COMPREHENSIVE METABOLIC PANEL
ALT: 54 U/L — ABNORMAL HIGH (ref 0–53)
AST: 28 U/L (ref 0–37)
Albumin: 4.7 g/dL (ref 3.5–5.2)
Alkaline Phosphatase: 54 U/L (ref 39–117)
BUN: 11 mg/dL (ref 6–23)
CO2: 30 mEq/L (ref 19–32)
Calcium: 9.5 mg/dL (ref 8.4–10.5)
Chloride: 105 mEq/L (ref 96–112)
Creatinine, Ser: 0.79 mg/dL (ref 0.40–1.50)
GFR: 107.32 mL/min (ref >60.00)
Glucose, Bld: 113 mg/dL — ABNORMAL HIGH (ref 70–99)
Potassium: 3.9 mEq/L (ref 3.5–5.1)
Sodium: 141 mEq/L (ref 135–145)
Total Bilirubin: 0.5 mg/dL (ref 0.2–1.2)
Total Protein: 7.1 g/dL (ref 6.0–8.3)

## 2019-08-07 LAB — LIPID PANEL
Cholesterol: 146 mg/dL (ref 0–200)
HDL: 41.9 mg/dL (ref >39.00)
LDL Cholesterol: 83 mg/dL (ref 0–99)
NonHDL: 104.31
Total CHOL/HDL Ratio: 3
Triglycerides: 105 mg/dL (ref 0.0–149.0)
VLDL: 21 mg/dL (ref 0.0–40.0)

## 2019-08-07 LAB — TSH: TSH: 1.9 u[IU]/mL (ref 0.35–4.50)

## 2019-08-07 LAB — HEMOGLOBIN A1C: Hgb A1c MFr Bld: 6.2 % (ref 4.6–6.5)

## 2019-08-07 MED ORDER — LORAZEPAM 0.5 MG PO TABS: ORAL_TABLET | ORAL | 1 refills | Status: DC

## 2019-08-07 MED ORDER — CITALOPRAM HYDROBROMIDE 10 MG PO TABS: 10.0000 mg | ORAL_TABLET | Freq: Every day | ORAL | 1 refills | Status: DC

## 2019-08-07 NOTE — Progress Notes (Signed)
Office Note 08/07/2019  CC:  Chief Complaint  Patient presents with  . Annual Exam    HPI:  Joshua Page is a 44 y.o. White male who is here for annual health maintenance exam.  Citalopram, doing good from mood and anxiety standpoint. Feels like he is 90% improved from anxiety/panic standpoint.  Wants to consider dec dose. Has had long term side effect of mild, ntermittent mental "fogginess" such as some mild difficulty with focus/concentration. This was not present prior to being on citalopram. Uses lorazepam rarely, maybe a couple times a month.  PMP AWARE reviewed today: most recent rx for loraz 0.5mg  was filled 05/22/19, # 30, rx by me. No red flags.  Past Medical History:  Diagnosis Date  . BPH with obstruction/lower urinary tract symptoms 2018   Pt declined bph med as of 06/2016  . Cutaneous abscess of face 04/2017  . Disequilibrium syndrome   . Elevated blood pressure reading without diagnosis of hypertension   . Elevated transaminase level 09/10/2016   [facility] wellness labs--AST 41, ALT 76.   Viral Hep screening NEG.  . GAD (generalized anxiety disorder)   . Hay fever   . Hepatic steatosis    u/s 09/2016  . Prediabetes    via work labs 2017/18; A1c 6.2% here 05/2016.  Improved to 5.7% 08/2016.  Then, A1c via [facility] on 09/10/16 was 6.1%.  A1c 6.2% July 2019-->[facility].    Past Surgical History:  Procedure Laterality Date  . HOLTER MONITOR  09/2016   NSR with occ sinus brady, rare PVC.    Family History  Problem Relation Age of Onset  . Diabetes Father   . Alcohol abuse Father   . Mental illness Father        Bipolar + schizophrenia  . Diabetes Maternal Grandmother   . Pancreatic cancer Maternal Grandfather     Social History   Socioeconomic History  . Marital status: Married    Spouse name: Not on file  . Number of children: Not on file  . Years of education: Not on file  . Highest education level: Not on file  Occupational History  . Not on file  Tobacco  Use  . Smoking status: Former Smoker    Packs/day: 0.25    Years: 15.00    Pack years: 3.75    Types: Cigarettes    Quit date: 02/15/2013    Years since quitting: 6.4  . Smokeless tobacco: Former Neurosurgeon    Types: Chew    Quit date: 02/15/2014  Substance and Sexual Activity  . Alcohol use: No  . Drug use: No  . Sexual activity: Not on file  Other Topics Concern  . Not on file  Social History Narrative   Married, 5 children.     Educ: college Surveyor, quantity)   [employer]	[place] in past.   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   No tobacco as of 2016.  5 pack-yr hx prior.  Hx of dipping snuff as well.   Social Determinants of Health   Financial Resource Strain:   . Difficulty of Paying Living Expenses:   Food Insecurity:   . Worried About Programme researcher, broadcasting/film/video in the Last Year:   . Barista in the Last Year:   Transportation Needs:   . Freight forwarder (Medical):   Marland Kitchen Lack of Transportation (Non-Medical):   Physical Activity:   . Days of Exercise per Week:   . Minutes of Exercise per  Session:   Stress:   . Feeling of Stress :   Social Connections:   . Frequency of Communication with Friends and Family:   . Frequency of Social Gatherings with Friends and Family:   . Attends Religious Services:   . Active Member of Clubs or Organizations:   . Attends Archivist Meetings:   Marland Kitchen Marital Status:   Intimate Partner Violence:   . Fear of Current or Ex-Partner:   . Emotionally Abused:   Marland Kitchen Physically Abused:   . Sexually Abused:     Outpatient Medications Prior to Visit  Medication Sig Dispense Refill  . ALBUTEROL IN Inhale into the lungs.    Marland Kitchen aspirin 81 MG chewable tablet Chew 1 tablet (81 mg total) by mouth daily. 30 tablet 1  . citalopram (CELEXA) 20 MG tablet Take 1 tablet (20 mg total) by mouth daily. 90 tablet 3  . LORazepam (ATIVAN) 0.5 MG tablet 1-2 tabs po bid prn anxiety/stress 30 tablet 1   No facility-administered medications  prior to visit.    No Known Allergies  ROS Review of Systems  Constitutional: Negative for appetite change, chills, fatigue and fever.  HENT: Negative for congestion, dental problem, ear pain and sore throat.   Eyes: Negative for discharge, redness and visual disturbance.  Respiratory: Negative for cough, chest tightness, shortness of breath and wheezing.   Cardiovascular: Negative for chest pain, palpitations and leg swelling.  Gastrointestinal: Negative for abdominal pain, blood in stool, diarrhea, nausea and vomiting.  Genitourinary: Negative for difficulty urinating, dysuria, flank pain, frequency, hematuria and urgency.  Musculoskeletal: Negative for arthralgias, back pain, joint swelling, myalgias and neck stiffness.  Skin: Negative for pallor and rash.  Neurological: Negative for dizziness, speech difficulty, weakness and headaches.  Hematological: Negative for adenopathy. Does not bruise/bleed easily.  Psychiatric/Behavioral: Negative for confusion and sleep disturbance. The patient is not nervous/anxious.     PE; Vitals with BMI 08/07/2019 01/17/2019 03/20/2018  Height 5\' 11"  5\' 11"  5\' 11"   Weight 224 lbs 227 lbs 222 lbs 4 oz  BMI 31.26 41.74 08.14  Systolic 481 856 314  Diastolic 80 79 76  Pulse 63 65 62    Gen: Alert, well appearing.  Patient is oriented to person, place, time, and situation. AFFECT: pleasant, lucid thought and speech. ENT: Ears: EACs clear, normal epithelium.  TMs with good light reflex and landmarks bilaterally.  Eyes: no injection, icteris, swelling, or exudate.  EOMI, PERRLA. Nose: no drainage or turbinate edema/swelling.  No injection or focal lesion.  Mouth: lips without lesion/swelling.  Oral mucosa pink and moist.  Dentition intact and without obvious caries or gingival swelling.  Oropharynx without erythema, exudate, or swelling.  Neck: supple/nontender.  No LAD, mass, or TM.  Carotid pulses 2+ bilaterally, without bruits. CV: RRR, no m/r/g.    LUNGS: CTA bilat, nonlabored resps, good aeration in all lung fields. ABD: soft, NT, ND, BS normal.  No hepatospenomegaly or mass.  No bruits. EXT: no clubbing, cyanosis, or edema.  Musculoskeletal: no joint swelling, erythema, warmth, or tenderness.  ROM of all joints intact. Skin - no sores or suspicious lesions or rashes or color changes   Pertinent labs:  Lab Results  Component Value Date   TSH 1.51 09/13/2017   Lab Results  Component Value Date   WBC 6.0 09/13/2017   HGB 15.1 09/13/2017   HCT 44.7 09/13/2017   MCV 89.7 09/13/2017   PLT 250.0 09/13/2017   Lab Results  Component Value Date  CREATININE 0.83 01/17/2019   BUN 9 01/17/2019   NA 141 01/17/2019   K 3.8 01/17/2019   CL 103 01/17/2019   CO2 30 01/17/2019   Lab Results  Component Value Date   ALT 55 (H) 09/13/2017   AST 29 09/13/2017   ALKPHOS 47 09/13/2017   BILITOT 0.6 09/13/2017   Lab Results  Component Value Date   CHOL 141 09/13/2017   Lab Results  Component Value Date   HDL 46.10 09/13/2017   Lab Results  Component Value Date   LDLCALC 83 09/13/2017   Lab Results  Component Value Date   TRIG 60.0 09/13/2017   Lab Results  Component Value Date   CHOLHDL 3 09/13/2017   Lab Results  Component Value Date   HGBA1C 6.1 01/17/2019   ASSESSMENT AND PLAN:   1) GAD with panic: has been stable long term on 20mg  citalopram qd and rare use of lorazepam.  Some mild dec concentration/focus as long term side effect. Discussed options today and decided to decrease dose to 10mg  citalopram to see if still good therapeutic effect and possibly less "mental fogginess" side effect. Loraz 0.5mg , 1-2 bid prn, #30, RF x 1 today.  2) Health maintenance exam: Reviewed age and gender appropriate health maintenance issues (prudent diet, regular exercise, health risks of tobacco and excessive alcohol, use of seatbelts, fire alarms in home, use of sunscreen).  Also reviewed age and gender appropriate health  screening as well as vaccine recommendations. Vaccines:  Tdap UTD.  Covid 19->has had this. Labs: fasting HP + A1c ordered (prediabetes, elev bp w/out dx htn, hepatic steatosis). Prostate ca screening: average risk patient= as per latest guidelines, start screening at 21 yrs of age. Colon ca screening: average risk patient= as per latest guidelines, start screening at 7 yrs of age.  An After Visit Summary was printed and given to the patient.  FOLLOW UP:  Return in about 2 months (around 10/07/2019) for f/u anx/med.  Signed:  54, MD           08/07/2019

## 2019-08-07 NOTE — Patient Instructions (Addendum)

## 2019-08-25 DIAGNOSIS — Z20822 Contact with and (suspected) exposure to covid-19: Secondary | ICD-10-CM | POA: Diagnosis not present

## 2019-08-25 DIAGNOSIS — Z03818 Encounter for observation for suspected exposure to other biological agents ruled out: Secondary | ICD-10-CM | POA: Diagnosis not present

## 2019-09-04 ENCOUNTER — Other Ambulatory Visit: Payer: Self-pay | Admitting: Family Medicine

## 2019-09-20 ENCOUNTER — Telehealth: Payer: Self-pay

## 2019-09-20 NOTE — Telephone Encounter (Signed)
Request for form completion:    Needs physical form completed for work. CPE was on 08/07/19 with Dr. Milinda Cave.  Gave form to [place].  Please email form when completed.  See Annabelle Harman or Diane to email form back to patient. Thank you.

## 2019-09-20 NOTE — Telephone Encounter (Signed)
Form completed but needing waist circumference for final completion. LM for pt to return call.

## 2019-09-21 NOTE — Telephone Encounter (Signed)
LM for pt to returncall

## 2019-09-26 NOTE — Telephone Encounter (Signed)
Patient returned call and able to provide waist circumference. Form completed and will email back to him. He stated he does not need the original form back. Will keep original and scan into chart.

## 2019-10-06 DIAGNOSIS — Z23 Encounter for immunization: Secondary | ICD-10-CM | POA: Diagnosis not present

## 2019-10-16 ENCOUNTER — Ambulatory Visit: Payer: BC Managed Care – PPO | Admitting: Family Medicine

## 2019-10-25 ENCOUNTER — Ambulatory Visit: Payer: BC Managed Care – PPO | Admitting: Family Medicine

## 2019-10-25 ENCOUNTER — Encounter: Payer: Self-pay | Admitting: Family Medicine

## 2019-10-25 ENCOUNTER — Other Ambulatory Visit: Payer: Self-pay

## 2019-10-25 VITALS — BP 129/82 | HR 54 | Temp 98.2°F | Resp 16 | Wt 220.8 lb

## 2019-10-25 DIAGNOSIS — F411 Generalized anxiety disorder: Secondary | ICD-10-CM | POA: Diagnosis not present

## 2019-10-25 DIAGNOSIS — F410 Panic disorder [episodic paroxysmal anxiety] without agoraphobia: Secondary | ICD-10-CM

## 2019-10-25 MED ORDER — LORAZEPAM 0.5 MG PO TABS: ORAL_TABLET | ORAL | 3 refills | Status: DC

## 2019-10-25 NOTE — Progress Notes (Signed)
OFFICE VISIT  10/25/2019  CC:  Chief Complaint  Patient presents with  . Follow-up    anxiety/meds   HPI:    Patient is a 44 y.o. Caucasian male who presents for 2 and 1/2 mo f/u GAD. A/P as of last visit: "GAD with panic: has been stable long term on 20mg  citalopram qd and rare use of lorazepam.  Some mild dec concentration/focus as long term side effect. Discussed options today and decided to decrease dose to 10mg  citalopram to see if still good therapeutic effect and possibly less "mental fogginess" side effect. Loraz 0.5mg , 1-2 bid prn, #30, RF x 1 today."  INTERIM HX: Some initial irritability when went to cital to 10mg  qd.  This resolved and he feels great. Good energy level, swimming a lot!.  Started flying a lot again, this has not caused panic. Still occ using benzo.  NO foggy feeling.   He is very happy with how he is feeling.   Past Medical History:  Diagnosis Date  . BPH with obstruction/lower urinary tract symptoms 2018   Pt declined bph med as of 06/2016  . Cutaneous abscess of face 04/2017  . Disequilibrium syndrome   . Elevated blood pressure reading without diagnosis of hypertension   . Elevated transaminase level 09/10/2016   [facility] wellness labs--AST 41, ALT 76.   Viral Hep screening NEG.  . GAD (generalized anxiety disorder)   . Hay fever   . Hepatic steatosis    u/s 09/2016  . Prediabetes    via work labs 2017/18; A1c 6.2% here 05/2016.  Improved to 5.7% 08/2016.  Then, A1c via [facility] on 09/10/16 was 6.1%.  A1c 6.2% July 2019-->[facility]. 6.2% 07/2019.    Past Surgical History:  Procedure Laterality Date  . HOLTER MONITOR  09/2016   NSR with occ sinus brady, rare PVC.    Outpatient Medications Prior to Visit  Medication Sig Dispense Refill  . ALBUTEROL IN Inhale into the lungs.    04-13-1970 aspirin 81 MG chewable tablet Chew 1 tablet (81 mg total) by mouth daily. 30 tablet 1  . citalopram (CELEXA) 10 MG tablet TAKE 1 TABLET BY MOUTH EVERY DAY 90 tablet 0  .  LORazepam (ATIVAN) 0.5 MG tablet 1-2 tabs po bid prn anxiety/stress 30 tablet 1   No facility-administered medications prior to visit.    No Known Allergies  ROS As per HPI  PE: Vitals with BMI 10/25/2019 08/07/2019 01/17/2019  Height - 5\' 11"  5\' 11"   Weight 220 lbs 13 oz 224 lbs 227 lbs  BMI - 31.26 31.67  Systolic 129 102 12/25/2019  Diastolic 82 80 79  Pulse 54 63 65   Gen: Alert, well appearing.  Patient is oriented to person, place, time, and situation. AFFECT: pleasant, lucid thought and speech. No further exam today.  LABS:  Lab Results  Component Value Date   TSH 1.90 08/07/2019   Lab Results  Component Value Date   WBC 6.5 08/07/2019   HGB 15.4 08/07/2019   HCT 45.2 08/07/2019   MCV 89.0 08/07/2019   PLT 263.0 08/07/2019   Lab Results  Component Value Date   CREATININE 0.79 08/07/2019   BUN 11 08/07/2019   NA 141 08/07/2019   K 3.9 08/07/2019   CL 105 08/07/2019   CO2 30 08/07/2019   Lab Results  Component Value Date   ALT 54 (H) 08/07/2019   AST 28 08/07/2019   ALKPHOS 54 08/07/2019   BILITOT 0.5 08/07/2019   Lab Results  Component Value Date   CHOL 146 08/07/2019   Lab Results  Component Value Date   HDL 41.90 08/07/2019   Lab Results  Component Value Date   LDLCALC 83 08/07/2019   Lab Results  Component Value Date   TRIG 105.0 08/07/2019   Lab Results  Component Value Date   CHOLHDL 3 08/07/2019   Lab Results  Component Value Date   HGBA1C 6.2 08/07/2019    IMPRESSION AND PLAN:  GAD with hx of panic: doing very well now on citalopram 10mg  qd. Continue loraz 0.5mg  1-2 bid prn.  Rx'd #60, RF x 3.  An After Visit Summary was printed and given to the patient.  FOLLOW UP: Return in about 6 months (around 04/23/2020) for f/u anxiety/benzo.  Signed:  06/23/2020, MD           10/25/2019

## 2019-11-25 ENCOUNTER — Other Ambulatory Visit: Payer: Self-pay | Admitting: Family Medicine

## 2020-01-25 ENCOUNTER — Other Ambulatory Visit: Payer: Self-pay | Admitting: Family Medicine

## 2020-02-15 ENCOUNTER — Other Ambulatory Visit: Payer: Self-pay | Admitting: Family Medicine

## 2020-03-16 ENCOUNTER — Other Ambulatory Visit: Payer: Self-pay | Admitting: Family Medicine

## 2020-04-27 ENCOUNTER — Encounter: Payer: Self-pay | Admitting: Family Medicine

## 2020-05-08 ENCOUNTER — Other Ambulatory Visit: Payer: Self-pay

## 2020-05-09 ENCOUNTER — Encounter: Payer: Self-pay | Admitting: Family Medicine

## 2020-05-09 ENCOUNTER — Ambulatory Visit: Payer: BC Managed Care – PPO | Admitting: Family Medicine

## 2020-05-09 VITALS — BP 132/88 | HR 74 | Temp 98.1°F | Ht 71.0 in | Wt 217.4 lb

## 2020-05-09 DIAGNOSIS — F411 Generalized anxiety disorder: Secondary | ICD-10-CM

## 2020-05-09 DIAGNOSIS — Z8659 Personal history of other mental and behavioral disorders: Secondary | ICD-10-CM

## 2020-05-09 MED ORDER — LORAZEPAM 0.5 MG PO TABS: ORAL_TABLET | ORAL | 1 refills | Status: DC

## 2020-05-09 MED ORDER — CITALOPRAM HYDROBROMIDE 20 MG PO TABS: 20.0000 mg | ORAL_TABLET | Freq: Every day | ORAL | 3 refills | Status: DC

## 2020-05-09 NOTE — Progress Notes (Signed)
OFFICE VISIT  05/09/2020  CC:  Chief Complaint  Patient presents with  . Anxiety    F/u with med refills    HPI:    Patient is a 45 y.o. Caucasian male who presents for 6 mo f/u GAD with hx of panic. A/P as of last visit: "GAD with hx of panic: doing very well now on citalopram 10mg  qd. Continue loraz 0.5mg  1-2 bid prn.  Rx'd #60, RF x 3"  INTERIM HX: Doing well, decided to stay on 20mg  citalopram dosing b/c lots of stressors, wife brain tumor/surgery, etc.  Has done well on this dose, though, w/out excessive worry or irritability.  No panic.  Mood is good.  Appetite fine, exercising regularly. Using lorazepam still sporadically prn excessive anxiety and/or anxiety-induced insomnia and this med works very well w/out side effects.  PMP AWARE reviewed today: most recent rx for lorazepam 0.5mg  was filled 03/16/20, # 30, rx by me. No red flags.  Past Medical History:  Diagnosis Date  . BPH with obstruction/lower urinary tract symptoms 2018   Pt declined bph med as of 06/2016  . Cutaneous abscess of face 04/2017  . Disequilibrium syndrome   . Elevated blood pressure reading without diagnosis of hypertension   . Elevated transaminase level 09/10/2016   [facility] wellness labs--AST 41, ALT 76.   Viral Hep screening NEG.  . GAD (generalized anxiety disorder)   . Hay fever   . Hepatic steatosis    u/s 09/2016  . Prediabetes    via work labs 2017/18; A1c 6.2% here 05/2016.  Improved to 5.7% 08/2016.  Then, A1c via [facility] on 09/10/16 was 6.1%.  A1c 6.2% July 2019-->[facility]. 6.2% 07/2019.    Past Surgical History:  Procedure Laterality Date  . HOLTER MONITOR  09/2016   NSR with occ sinus brady, rare PVC.    Outpatient Medications Prior to Visit  Medication Sig Dispense Refill  . ALBUTEROL IN Inhale into the lungs.    08/2019 aspirin 81 MG chewable tablet Chew 1 tablet (81 mg total) by mouth daily. 30 tablet 1  . citalopram (CELEXA) 10 MG tablet TAKE 1 TABLET BY MOUTH EVERY DAY (Patient taking  differently: 20 mg.) 90 tablet 1  . LORazepam (ATIVAN) 0.5 MG tablet 1-2 tabs po bid prn anxiety/stress 30 tablet 3   No facility-administered medications prior to visit.    No Known Allergies  ROS As per HPI  PE: Vitals with BMI 05/09/2020 10/25/2019 08/07/2019  Height 5\' 11"  - 5\' 11"   Weight 217 lbs 6 oz 220 lbs 13 oz 224 lbs  BMI 30.33 - 31.26  Systolic 132 129 12/25/2019  Diastolic 88 82 80  Pulse 74 54 63   Gen: Alert, well appearing.  Patient is oriented to person, place, time, and situation. AFFECT: pleasant, lucid thought and speech. No further exam today.  LABS:    Chemistry      Component Value Date/Time   NA 141 08/07/2019 1014   NA 141 09/10/2016 0000   K 3.9 08/07/2019 1014   CL 105 08/07/2019 1014   CO2 30 08/07/2019 1014   BUN 11 08/07/2019 1014   BUN 13 09/10/2016 0000   CREATININE 0.79 08/07/2019 1014   GLU 114 09/10/2016 0000      Component Value Date/Time   CALCIUM 9.5 08/07/2019 1014   ALKPHOS 54 08/07/2019 1014   AST 28 08/07/2019 1014   ALT 54 (H) 08/07/2019 1014   BILITOT 0.5 08/07/2019 1014      IMPRESSION  AND PLAN:  GAD with hx of panic. Doing great on citalopram 20mg  qd and we'll continue this. He does eventually want to try lower dosing/wean but not considering this change at this time.  I agree with this plan. RF'd citalopram 20mg  qd for 90d supply as well as lorazepam 0.5mg  today (1-2 bid prn, #90, RF x 1---"90d supply". [facility] UTD.  An After Visit Summary was printed and given to the patient.  FOLLOW UP: Return in about 6 months (around 11/09/2020) for annual CPE (fasting) + RCI.  Signed:  , MD           05/09/2020

## 2020-05-23 ENCOUNTER — Other Ambulatory Visit: Payer: Self-pay | Admitting: Family Medicine

## 2020-09-16 LAB — LIPID PANEL
Cholesterol: 144 (ref 0–200)
HDL: 44 (ref 35–70)
LDL Cholesterol: 79
LDl/HDL Ratio: 3.3
Triglycerides: 111 (ref 40–160)

## 2020-09-16 LAB — CBC AND DIFFERENTIAL
HCT: 46 (ref 41–53)
Hemoglobin: 15.6 (ref 13.5–17.5)
Platelets: 246 (ref 150–399)
WBC: 5.6

## 2020-09-16 LAB — COMPREHENSIVE METABOLIC PANEL
Albumin: 4.5 (ref 3.5–5.0)
Calcium: 9.4 (ref 8.7–10.7)
GFR calc non Af Amer: 112
Globulin: 2.3

## 2020-09-16 LAB — CBC: RBC: 5.2 — AB (ref 3.87–5.11)

## 2020-09-16 LAB — HEPATIC FUNCTION PANEL
ALT: 32 (ref 10–40)
AST: 22 (ref 14–40)
Alkaline Phosphatase: 49 (ref 25–125)
Bilirubin, Direct: 0.1 (ref 0.01–0.4)
Bilirubin, Total: 0.6

## 2020-09-16 LAB — BASIC METABOLIC PANEL
BUN: 15 (ref 4–21)
Chloride: 105 (ref 99–108)
Creatinine: 0.8 (ref 0.6–1.3)
Glucose: 123
Potassium: 4 (ref 3.4–5.3)
Sodium: 140 (ref 137–147)

## 2020-09-16 LAB — IRON,TIBC AND FERRITIN PANEL: Iron: 117

## 2020-09-16 LAB — HEMOGLOBIN A1C: Hemoglobin A1C: 5.9

## 2020-09-22 ENCOUNTER — Encounter: Payer: Self-pay | Admitting: Family Medicine

## 2020-10-25 DIAGNOSIS — Z23 Encounter for immunization: Secondary | ICD-10-CM | POA: Diagnosis not present

## 2021-05-10 ENCOUNTER — Other Ambulatory Visit: Payer: Self-pay | Admitting: Family Medicine

## 2021-05-14 ENCOUNTER — Encounter: Payer: Self-pay | Admitting: Family Medicine

## 2021-05-14 ENCOUNTER — Other Ambulatory Visit: Payer: Self-pay | Admitting: Family Medicine

## 2021-05-14 ENCOUNTER — Ambulatory Visit: Payer: BC Managed Care – PPO | Admitting: Family Medicine

## 2021-05-14 VITALS — BP 137/87 | HR 79 | Temp 98.5°F | Ht 71.0 in | Wt 223.6 lb

## 2021-05-14 DIAGNOSIS — Z79899 Other long term (current) drug therapy: Secondary | ICD-10-CM

## 2021-05-14 DIAGNOSIS — F411 Generalized anxiety disorder: Secondary | ICD-10-CM

## 2021-05-14 DIAGNOSIS — K760 Fatty (change of) liver, not elsewhere classified: Secondary | ICD-10-CM | POA: Diagnosis not present

## 2021-05-14 DIAGNOSIS — J019 Acute sinusitis, unspecified: Secondary | ICD-10-CM

## 2021-05-14 DIAGNOSIS — Z7689 Persons encountering health services in other specified circumstances: Secondary | ICD-10-CM

## 2021-05-14 DIAGNOSIS — R7303 Prediabetes: Secondary | ICD-10-CM | POA: Diagnosis not present

## 2021-05-14 DIAGNOSIS — E669 Obesity, unspecified: Secondary | ICD-10-CM

## 2021-05-14 LAB — LIPID PANEL
Cholesterol: 137 mg/dL (ref 0–200)
HDL: 49.5 mg/dL (ref >39.00)
LDL Cholesterol: 70 mg/dL (ref 0–99)
NonHDL: 87.26
Total CHOL/HDL Ratio: 3
Triglycerides: 85 mg/dL (ref 0.0–149.0)
VLDL: 17 mg/dL (ref 0.0–40.0)

## 2021-05-14 LAB — COMPREHENSIVE METABOLIC PANEL
ALT: 40 U/L (ref 0–53)
AST: 21 U/L (ref 0–37)
Albumin: 4.7 g/dL (ref 3.5–5.2)
Alkaline Phosphatase: 58 U/L (ref 39–117)
BUN: 13 mg/dL (ref 6–23)
CO2: 29 mEq/L (ref 19–32)
Calcium: 9.5 mg/dL (ref 8.4–10.5)
Chloride: 103 mEq/L (ref 96–112)
Creatinine, Ser: 0.93 mg/dL (ref 0.40–1.50)
GFR: 100.06 mL/min (ref >60.00)
Glucose, Bld: 97 mg/dL (ref 70–99)
Potassium: 4.4 mEq/L (ref 3.5–5.1)
Sodium: 140 mEq/L (ref 135–145)
Total Bilirubin: 0.8 mg/dL (ref 0.2–1.2)
Total Protein: 7.4 g/dL (ref 6.0–8.3)

## 2021-05-14 LAB — CBC
HCT: 46.5 % (ref 39.0–52.0)
Hemoglobin: 15.6 g/dL (ref 13.0–17.0)
MCHC: 33.6 g/dL (ref 30.0–36.0)
MCV: 90 fl (ref 78.0–100.0)
Platelets: 267 10*3/uL (ref 150.0–400.0)
RBC: 5.17 Mil/uL (ref 4.22–5.81)
RDW: 13.1 % (ref 11.5–15.5)
WBC: 10.1 10*3/uL (ref 4.0–10.5)

## 2021-05-14 LAB — TSH: TSH: 1.36 u[IU]/mL (ref 0.35–5.50)

## 2021-05-14 LAB — HEMOGLOBIN A1C: Hgb A1c MFr Bld: 6.4 % (ref 4.6–6.5)

## 2021-05-14 MED ORDER — AZITHROMYCIN 250 MG PO TABS: ORAL_TABLET | ORAL | 0 refills | Status: DC

## 2021-05-14 MED ORDER — OZEMPIC (0.25 OR 0.5 MG/DOSE) 2 MG/1.5ML ~~LOC~~ SOPN: PEN_INJECTOR | SUBCUTANEOUS | 0 refills | Status: DC

## 2021-05-14 MED ORDER — LORAZEPAM 0.5 MG PO TABS: ORAL_TABLET | ORAL | 1 refills | Status: DC

## 2021-05-14 NOTE — Telephone Encounter (Signed)
Original rx sent for qty 1.85ml is no longer available/back ordered. Pharamacy requesting MD authorization for qty of 27ml instead. ? ?Okay for change? Med pending ?

## 2021-05-14 NOTE — Progress Notes (Signed)
Office Note ?05/14/2021 ? ?CC:  ?Chief Complaint  ?Patient presents with  ? Anxiety  ? ?Patient is a 46 y.o. male who is here for follow-up GAD and prediabetes.  Also wants to discuss weight management and current URI. ?A/P as of last visit: ?"GAD with hx of panic. ?Doing great on citalopram 20mg  qd and we'll continue this. ?He does eventually want to try lower dosing/wean but not considering this change at this time.  I agree with this plan" ? ?INTERIM HX: ?He is happy to say he has gradually weaned himself off citalopram.  Has been off completely for the last few weeks and feels great. ?He still does occasionally need and Ativan, particularly when he flies--which she does quite a bit of for his job.  This helps well and has no adverse side effects. ? ?He asks about Ozempic to help with weight loss.  He is working out 5 days a week and is really trying to work on his diet but admits snacking in the evening is a downfall for him. ?He does have prediabetes and fatty liver. ? ?Nasal congestion, postnasal drip, mild sore throat, significant cough--all for the last week and not improving. ?No fever, wheezing, shortness of breath, or face pain. ?Several of his kids had same illness, got better with Z-Pak, which she is interested in today. ? ?PMP AWARE reviewed today: most recent rx for lorazepam was filled 09/06/20, # 90, rx by me. ?No red flags. ? ?ROS as above, plus--> no CP, no cough, no dizziness, no HAs, no rashes, no melena/hematochezia.  No polyuria or polydipsia.  No myalgias or arthralgias.  No focal weakness, paresthesias, or tremors.  No acute vision or hearing abnormalities.  No dysuria or unusual/new urinary urgency or frequency.  No recent changes in lower legs. ?No n/v/d or abd pain.  No palpitations.   ? ?Past Medical History:  ?Diagnosis Date  ? BPH with obstruction/lower urinary tract symptoms 2018  ? Pt declined bph med as of 06/2016  ? Cutaneous abscess of face 04/2017  ? Disequilibrium syndrome   ?  Elevated blood pressure reading without diagnosis of hypertension   ? Elevated transaminase level 09/10/2016  ? [facility] wellness labs--AST 41, ALT 76.   Viral Hep screening NEG.  ? GAD (generalized anxiety disorder)   ? Hay fever   ? Hepatic steatosis   ? u/s 09/2016  ? Prediabetes   ? via work labs 2017/18; A1c 6.2% here 05/2016.  Improved to 5.7% 08/2016.  Then, A1c via [facility] on 09/10/16 was 6.1%.  A1c 6.2% July 2019-->TLC. 6.2% 07/2019. 5.9% 09/2020  ? ? ?Past Surgical History:  ?Procedure Laterality Date  ? HOLTER MONITOR  09/2016  ? NSR with occ sinus brady, rare PVC.  ? ? ?Family History  ?Problem Relation Age of Onset  ? Diabetes Father   ? Alcohol abuse Father   ? Mental illness Father   ?     Bipolar + schizophrenia  ? Diabetes Maternal Grandmother   ? Pancreatic cancer Maternal Grandfather   ? ? ?Social History  ? ?Socioeconomic History  ? Marital status: Married  ?  Spouse name: Not on file  ? Number of children: Not on file  ? Years of education: Not on file  ? Highest education level: Bachelor's degree (e.g., BA, AB, BS)  ?Occupational History  ? Not on file  ?Tobacco Use  ? Smoking status: Former  ?  Packs/day: 0.25  ?  Years: 15.00  ?  Pack  years: 3.75  ?  Types: Cigarettes  ?  Quit date: 02/15/2013  ?  Years since quitting: 8.2  ? Smokeless tobacco: Former  ?  Types: Chew  ?  Quit date: 02/15/2014  ?Substance and Sexual Activity  ? Alcohol use: No  ? Drug use: No  ? Sexual activity: Not on file  ?Other Topics Concern  ? Not on file  ?Social History Narrative  ? Married, 5 children.    ? 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 [employer] in past.  ? 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  ? No tobacco as of 2016.  5 pack-yr hx prior.  Hx of dipping snuff as well.  ? ?Social Determinants of Health  ? ?Financial Resource Strain: Low Risk   ? Difficulty of Paying Living Expenses: Not hard at all  ?Food Insecurity: No Food Insecurity  ? Worried About Programme researcher, broadcasting/film/video in the Last Year: Never true   ? Ran Out of Food in the Last Year: Never true  ?Transportation Needs: No Transportation Needs  ? Lack of Transportation (Medical): No  ? Lack of Transportation (Non-Medical): No  ?Physical Activity: Sufficiently Active  ? Days of Exercise per Week: 7 days  ? Minutes of Exercise per Session: 30 min  ?Stress: No Stress Concern Present  ? Feeling of Stress : Only a little  ?Social Connections: Moderately Integrated  ? Frequency of Communication with Friends and Family: Once a week  ? Frequency of Social Gatherings with Friends and Family: Once a week  ? Attends Religious Services: More than 4 times per year  ? Active Member of Clubs or Organizations: Yes  ? Attends Banker Meetings: More than 4 times per year  ? Marital Status: Married  ?Intimate Partner Violence: Not on file  ? ? ?Outpatient Medications Prior to Visit  ?Medication Sig Dispense Refill  ? ALBUTEROL IN Inhale into the lungs.    ? aspirin 81 MG chewable tablet Chew 1 tablet (81 mg total) by mouth daily. 30 tablet 1  ? citalopram (CELEXA) 20 MG tablet Take 1 tablet (20 mg total) by mouth daily. (Patient not taking: Reported on 05/14/2021) 90 tablet 3  ? LORazepam (ATIVAN) 0.5 MG tablet 1-2 tabs po bid prn anxiety/stress 90 tablet 1  ? ?No facility-administered medications prior to visit.  ? ? ?No Known Allergies ? ? ?PE; ? ?  05/14/2021  ?  8:12 AM 05/09/2020  ?  3:30 PM 10/25/2019  ? 11:40 AM  ?Vitals with BMI  ?Height 5\' 11"  5\' 11"    ?Weight 223 lbs 10 oz 217 lbs 6 oz 220 lbs 13 oz  ?BMI 31.2 30.33   ?Systolic 137 132  ?Diastolic 87 88 82  ?Pulse 79 74 54  ? ?VS: noted--normal. ?Gen: alert, NAD, NONTOXIC APPEARING. ?HEENT: eyes without injection, drainage, or swelling.  Ears: EACs clear, TMs with normal light reflex and landmarks.  Nose: Clear rhinorrhea, with some dried, crusty exudate adherent to mildly injected mucosa.  No purulent d/c.  No paranasal sinus TTP.  No facial swelling.  Throat and mouth without focal lesion.  No pharyngial  swelling, erythema, or exudate.   ?Neck: supple, no LAD.   ?LUNGS: CTA bilat, nonlabored resps.   ?CV: RRR, no m/r/g. ?EXT: no c/c/e ?SKIN: no rash ? ?Pertinent labs:  ?Lab Results  ?Component Value Date  ? TSH 1.90 08/07/2019  ? ?Lab Results  ?Component Value Date  ? WBC 5.6 09/16/2020  ?  HGB 15.6 09/16/2020  ? HCT 46 09/16/2020  ? MCV 89.0 08/07/2019  ? PLT 246 09/16/2020  ? ?Lab Results  ?Component Value Date  ? CREATININE 0.8 09/16/2020  ? BUN 15 09/16/2020  ? NA 140 09/16/2020  ? K 4.0 09/16/2020  ? CL 105 09/16/2020  ? CO2 30 08/07/2019  ? ?Lab Results  ?Component Value Date  ? ALT 32 09/16/2020  ? AST 22 09/16/2020  ? ALKPHOS 49 09/16/2020  ? BILITOT 0.5 08/07/2019  ? ?Lab Results  ?Component Value Date  ? CHOL 144 09/16/2020  ? ?Lab Results  ?Component Value Date  ? HDL 44 09/16/2020  ? ?Lab Results  ?Component Value Date  ? LDLCALC 79 09/16/2020  ? ?Lab Results  ?Component Value Date  ? TRIG 111 09/16/2020  ? ?Lab Results  ?Component Value Date  ? CHOLHDL 3 08/07/2019  ? ?Lab Results  ?Component Value Date  ? HGBA1C 5.9 09/16/2020  ? ?ASSESSMENT AND PLAN:  ? ?#1 GAD and history of panic. ?Doing very well since weaning off of citalopram. ?Will refill Ativan to use as needed periods of near panic which she has had in the past, particularly surrounding airplane travel. ?Urine drug screen today. ? ?#2.  Obesity class I.  He has prediabetes and fatty liver. ?He is doing maximal exercise and is beginning to make changes to make diet better. ?Discussed use of Ozempic as adjunct and he was definitely in favor of trying this today. ?Discussed starting at 0.25 subcu weekly dosing x4 doses.  Nausea the most common side effect. ?Labs:cmet,flp,tsh,Hba1c today. ? ?#3 acute sinusitis with PND cough. ?Recommended stop Zyrtec and start Mucinex DM. ?Azithromycin x5 days prescribed. ? ?An After Visit Summary was printed and given to the patient. ? ?FOLLOW UP:  Return in about 4 weeks (around 06/11/2021) for f/u wt  mgmt. ? ?Signed:  Santiago Bumpers, MD           05/14/2021 ? ?

## 2021-05-16 LAB — DRUG MONITORING PANEL 376104, URINE
Amphetamines: NEGATIVE ng/mL (ref <500)
Barbiturates: NEGATIVE ng/mL (ref <300)
Benzodiazepines: NEGATIVE ng/mL (ref <100)
Cocaine Metabolite: NEGATIVE ng/mL (ref <150)
Desmethyltramadol: NEGATIVE ng/mL (ref <100)
Opiates: NEGATIVE ng/mL (ref <100)
Oxycodone: NEGATIVE ng/mL (ref <100)
Tramadol: NEGATIVE ng/mL (ref <100)

## 2021-05-16 LAB — DM TEMPLATE

## 2021-06-16 ENCOUNTER — Ambulatory Visit: Payer: BC Managed Care – PPO | Admitting: Family Medicine

## 2021-06-16 NOTE — Progress Notes (Deleted)
OFFICE VISIT  06/16/2021  CC: No chief complaint on file.   Patient is a 46 y.o. male who presents for 1 mo f/u wt mgmt/prediabetes/fatty liver. A/P as of last visit: "#1 GAD and history of panic. Doing very well since weaning off of citalopram. Will refill Ativan to use as needed periods of near panic which she has had in the past, particularly surrounding airplane travel. Urine drug screen today.   #2.  Obesity class I.  He has prediabetes and fatty liver. He is doing maximal exercise and is beginning to make changes to make diet better. Discussed use of Ozempic as adjunct and he was definitely in favor of trying this today. Discussed starting at 0.25 subcu weekly dosing x4 doses.  Nausea the most common side effect. Labs:cmet,flp,tsh,Hba1c today.   #3 acute sinusitis with PND cough. Recommended stop Zyrtec and start Mucinex DM. Azithromycin x5 days prescribed."  INTERIM HX: Hemoglobin A1c was 6.4% last visit 1 month ago.  All other labs excellent.  ***   Past Medical History:  Diagnosis Date   BPH with obstruction/lower urinary tract symptoms 2018   Pt declined bph med as of 06/2016   Cutaneous abscess of face 04/2017   Disequilibrium syndrome    Elevated blood pressure reading without diagnosis of hypertension    Elevated transaminase level 09/10/2016   [facility] wellness labs--AST 41, ALT 76.   Viral Hep screening NEG.   GAD (generalized anxiety disorder)    Hay fever    Hepatic steatosis    u/s 09/2016   Prediabetes    via work labs 2017/18; A1c 6.2% here 05/2016.  Improved to 5.7% 08/2016.  Then, A1c via [facility] on 09/10/16 was 6.1%.  A1c 6.2% July 2019-->TLC. 6.2% 07/2019. 5.9% 09/2020    Past Surgical History:  Procedure Laterality Date   HOLTER MONITOR  09/2016   NSR with occ sinus brady, rare PVC.    Outpatient Medications Prior to Visit  Medication Sig Dispense Refill   ALBUTEROL IN Inhale into the lungs.     aspirin 81 MG chewable tablet Chew 1 tablet (81 mg  total) by mouth daily. 30 tablet 1   LORazepam (ATIVAN) 0.5 MG tablet 1-2 tabs po bid prn anxiety/stress 90 tablet 1   Semaglutide,0.25 or 0.5MG /DOS, (OZEMPIC, 0.25 OR 0.5 MG/DOSE,) 2 MG/3ML SOPN Inject 0.25 mg into the skin once a week. 3 mL 0   azithromycin (ZITHROMAX) 250 MG tablet 2 tabs po qd x 1d, then 1 tab po qd x 4d 6 tablet 0   No facility-administered medications prior to visit.    No Known Allergies  ROS As per HPI  PE:    05/14/2021    8:12 AM 05/09/2020    3:30 PM 10/25/2019   11:40 AM  Vitals with BMI  Height 5\' 11"  5\' 11"    Weight 223 lbs 10 oz 217 lbs 6 oz 220 lbs 13 oz  BMI 31.2 30.33   Systolic 137 132  Diastolic 87 88 82  Pulse 79 74 54     Physical Exam  ***  LABS:  Last CBC Lab Results  Component Value Date   WBC 10.1 05/14/2021   HGB 15.6 05/14/2021   HCT 46.5 05/14/2021   MCV 90.0 05/14/2021   MCH 30.2 07/22/2016   RDW 13.1 05/14/2021   PLT 267.0 05/14/2021   Last metabolic panel Lab Results  Component Value Date   GLUCOSE 97 05/14/2021   NA 140 05/14/2021   K 4.4 05/14/2021  CL 103 05/14/2021   CO2 29 05/14/2021   BUN 13 05/14/2021   CREATININE 0.93 05/14/2021   GFRNONAA 112 09/16/2020   CALCIUM 9.5 05/14/2021   PROT 7.4 05/14/2021   ALBUMIN 4.7 05/14/2021   BILITOT 0.8 05/14/2021   ALKPHOS 58 05/14/2021   AST 21 05/14/2021   ALT 40 05/14/2021   ANIONGAP 8 07/22/2016   Last lipids Lab Results  Component Value Date   CHOL 137 05/14/2021   HDL 49.50 05/14/2021   LDLCALC 70 05/14/2021   TRIG 85.0 05/14/2021   CHOLHDL 3 05/14/2021   Last hemoglobin A1c Lab Results  Component Value Date   HGBA1C 6.4 05/14/2021   Last thyroid functions Lab Results  Component Value Date   TSH 1.36 05/14/2021   IMPRESSION AND PLAN:  No problem-specific Assessment & Plan notes found for this encounter.   An After Visit Summary was printed and given to the patient.  FOLLOW UP: No follow-ups on file.  Signed:  Santiago Bumpers, MD            06/16/2021

## 2021-06-18 ENCOUNTER — Encounter: Payer: Self-pay | Admitting: Family Medicine

## 2021-06-18 ENCOUNTER — Ambulatory Visit: Payer: BC Managed Care – PPO | Admitting: Family Medicine

## 2021-06-18 VITALS — BP 109/68 | HR 75 | Temp 98.3°F | Ht 71.0 in | Wt 215.4 lb

## 2021-06-18 DIAGNOSIS — R7303 Prediabetes: Secondary | ICD-10-CM | POA: Diagnosis not present

## 2021-06-18 DIAGNOSIS — Z7689 Persons encountering health services in other specified circumstances: Secondary | ICD-10-CM

## 2021-06-18 MED ORDER — OZEMPIC (0.25 OR 0.5 MG/DOSE) 2 MG/3ML ~~LOC~~ SOPN: PEN_INJECTOR | SUBCUTANEOUS | 1 refills | Status: DC

## 2021-06-18 NOTE — Progress Notes (Signed)
OFFICE VISIT ? ?06/18/2021 ? ?CC:  ?Chief Complaint  ?Patient presents with  ? Weight Management  ?  Pt doing well on Ozempic  ? ?Patient is a 46 y.o. male who presents for 1 month follow-up weight management, on Ozempic. ?A/P as of last visit: ?"#1 GAD and history of panic. ?Doing very well since weaning off of citalopram. ?Will refill Ativan to use as needed periods of near panic which she has had in the past, particularly surrounding airplane travel. ?Urine drug screen today. ?  ?#2.  Obesity class I.  He has prediabetes and fatty liver. ?He is doing maximal exercise and is beginning to make changes to make diet better. ?Discussed use of Ozempic as adjunct and he was definitely in favor of trying this today. ?Discussed starting at 0.25 subcu weekly dosing x4 doses.  Nausea the most common side effect. ?Labs:cmet,flp,tsh,Hba1c today. ?  ?#3 acute sinusitis with PND cough. ?Recommended stop Zyrtec and start Mucinex DM. ?Azithromycin x5 days prescribed." ? ?INTERIM HX: ?Joshua Page feels well. ?No side effects from Ozempic. ?He does note a little bit of decreased appetite and less food craving since getting on this medication. ?Our scales show him down 8 pounds since starting Ozempic a month ago. ?He maintains great exercise habits and healthy diet. ? ? ? ?Past Medical History:  ?Diagnosis Date  ? BPH with obstruction/lower urinary tract symptoms 2018  ? Pt declined bph med as of 06/2016  ? Cutaneous abscess of face 04/2017  ? Disequilibrium syndrome   ? Elevated blood pressure reading without diagnosis of hypertension   ? Elevated transaminase level 09/10/2016  ? [facility] wellness labs--AST 41, ALT 76.   Viral Hep screening NEG.  ? GAD (generalized anxiety disorder)   ? Hay fever   ? Hepatic steatosis   ? u/s 09/2016  ? Prediabetes   ? via work labs 2017/18; A1c 6.2% here 05/2016.  Improved to 5.7% 08/2016.  Then, A1c via [facility] on 09/10/16 was 6.1%.  A1c 6.2% July 2019-->TLC. 6.2% 07/2019. 5.9% 09/2020  ? ? ?Past Surgical History:   ?Procedure Laterality Date  ? HOLTER MONITOR  09/2016  ? NSR with occ sinus brady, rare PVC.  ? ? ?Outpatient Medications Prior to Visit  ?Medication Sig Dispense Refill  ? ALBUTEROL IN Inhale into the lungs.    ? aspirin 81 MG chewable tablet Chew 1 tablet (81 mg total) by mouth daily. 30 tablet 1  ? LORazepam (ATIVAN) 0.5 MG tablet 1-2 tabs po bid prn anxiety/stress 90 tablet 1  ? Semaglutide,0.25 or 0.5MG /DOS, (OZEMPIC, 0.25 OR 0.5 MG/DOSE,) 2 MG/3ML SOPN Inject 0.25 mg into the skin once a week. 3 mL 0  ? ?No facility-administered medications prior to visit.  ? ? ?No Known Allergies ? ?ROS ?As per HPI ? ?PE: ? ?  06/18/2021  ?  2:54 PM 05/14/2021  ?  8:12 AM 05/09/2020  ?  3:30 PM  ?Vitals with BMI  ?Height 5\' 11"  5\' 11"  5\' 11"   ?Weight 215 lbs 6 oz 223 lbs 10 oz 217 lbs 6 oz  ?BMI 30.06 31.2 30.33  ?Systolic 0000000 0000000 [id number]  ?Diastolic 68 87 88  ?Pulse 75 79 74  ? ?Physical Exam ? ?Gen: Alert, well appearing.  Patient is oriented to person, place, time, and situation. ?AFFECT: pleasant, lucid thought and speech. ?No further exam today ? ?LABS:  ?Last CBC ?Lab Results  ?Component Value Date  ? WBC 10.1 05/14/2021  ? HGB 15.6 05/14/2021  ? HCT 46.5  05/14/2021  ? MCV 90.0 05/14/2021  ? MCH 30.2 07/22/2016  ? RDW 13.1 05/14/2021  ? PLT 267.0 05/14/2021  ? ?Last metabolic panel ?Lab Results  ?Component Value Date  ? GLUCOSE 97 05/14/2021  ? NA 140 05/14/2021  ? K 4.4 05/14/2021  ? CL 103 05/14/2021  ? CO2 29 05/14/2021  ? BUN 13 05/14/2021  ? CREATININE 0.93 05/14/2021  ? GFRNONAA 112 09/16/2020  ? CALCIUM 9.5 05/14/2021  ? PROT 7.4 05/14/2021  ? ALBUMIN 4.7 05/14/2021  ? BILITOT 0.8 05/14/2021  ? ALKPHOS 58 05/14/2021  ? AST 21 05/14/2021  ? ALT 40 05/14/2021  ? ANIONGAP 8 07/22/2016  ? ?Last lipids ?Lab Results  ?Component Value Date  ? CHOL 137 05/14/2021  ? HDL 49.50 05/14/2021  ? Simpson 70 05/14/2021  ? TRIG 85.0 05/14/2021  ? CHOLHDL 3 05/14/2021  ? ?Last hemoglobin A1c ?Lab Results  ?Component Value Date  ? HGBA1C  6.4 05/14/2021  ? ?IMPRESSION AND PLAN: ? ?Weight management, prediabetes, class I obesity. ?8 pounds of loss on Ozempic 0.25 weekly x1 month. ?Increase Ozempic to 0.5 mg subcu weekly and we will keep him on this dose until I see him again in 2 months. ?Next hemoglobin a1c will be done at that visit. ? ?An After Visit Summary was printed and given to the patient. ? ?FOLLOW UP: Return in about 2 months (around 08/18/2021) for Follow-up weight management, prediabetes. ? ?Signed:  Crissie Sickles, MD           06/18/2021 ? ?

## 2021-08-21 ENCOUNTER — Other Ambulatory Visit: Payer: Self-pay | Admitting: Family Medicine

## 2021-09-14 LAB — BASIC METABOLIC PANEL
BUN: 11 (ref 4–21)
Chloride: 105 (ref 99–108)
Creatinine: 0.9 (ref 0.6–1.3)
Glucose: 111
Potassium: 4 mEq/L (ref 3.5–5.1)
Sodium: 141 (ref 137–147)

## 2021-09-14 LAB — CBC AND DIFFERENTIAL
HCT: 47 (ref 41–53)
Hemoglobin: 16 (ref 13.5–17.5)
Platelets: 249 10*3/uL (ref 150–400)
WBC: 5.7

## 2021-09-14 LAB — LIPID PANEL
Cholesterol: 134 (ref 0–200)
HDL: 45 (ref 35–70)
LDL Cholesterol: 71
Triglycerides: 95 (ref 40–160)

## 2021-09-14 LAB — HEPATIC FUNCTION PANEL
ALT: 42 U/L — AB (ref 10–40)
AST: 25 (ref 14–40)
Alkaline Phosphatase: 52 (ref 25–125)
Bilirubin, Direct: 0.1 (ref 0.01–0.4)
Bilirubin, Total: 0.5

## 2021-09-14 LAB — COMPREHENSIVE METABOLIC PANEL
Albumin: 4.3 (ref 3.5–5.0)
Globulin: 2.4
eGFR: 109

## 2021-09-14 LAB — IRON,TIBC AND FERRITIN PANEL: Iron: 69

## 2021-09-14 LAB — CBC: RBC: 5.28 — AB (ref 3.87–5.11)

## 2021-09-14 LAB — HEMOGLOBIN A1C: Hemoglobin A1C: 5.4

## 2021-09-15 ENCOUNTER — Other Ambulatory Visit: Payer: Self-pay | Admitting: Family Medicine

## 2021-09-15 DIAGNOSIS — X32XXXS Exposure to sunlight, sequela: Secondary | ICD-10-CM | POA: Diagnosis not present

## 2021-09-15 DIAGNOSIS — D1801 Hemangioma of skin and subcutaneous tissue: Secondary | ICD-10-CM | POA: Diagnosis not present

## 2021-09-15 DIAGNOSIS — Z129 Encounter for screening for malignant neoplasm, site unspecified: Secondary | ICD-10-CM | POA: Diagnosis not present

## 2021-09-15 DIAGNOSIS — L814 Other melanin hyperpigmentation: Secondary | ICD-10-CM | POA: Diagnosis not present

## 2021-09-15 DIAGNOSIS — L570 Actinic keratosis: Secondary | ICD-10-CM | POA: Diagnosis not present

## 2021-09-17 ENCOUNTER — Other Ambulatory Visit: Payer: Self-pay | Admitting: Family Medicine

## 2021-09-17 NOTE — Telephone Encounter (Signed)
Patient scheduled appt for 8/4 with Dr. Milinda Cave

## 2021-09-18 ENCOUNTER — Ambulatory Visit: Payer: BC Managed Care – PPO | Admitting: Family Medicine

## 2021-09-18 ENCOUNTER — Encounter: Payer: Self-pay | Admitting: Family Medicine

## 2021-09-18 VITALS — BP 112/76 | HR 81 | Temp 98.1°F | Ht 71.0 in | Wt 211.6 lb

## 2021-09-18 DIAGNOSIS — R7303 Prediabetes: Secondary | ICD-10-CM

## 2021-09-18 DIAGNOSIS — Z7689 Persons encountering health services in other specified circumstances: Secondary | ICD-10-CM

## 2021-09-18 MED ORDER — OZEMPIC (0.25 OR 0.5 MG/DOSE) 2 MG/3ML ~~LOC~~ SOPN
PEN_INJECTOR | SUBCUTANEOUS | 3 refills | Status: DC
Start: 2021-09-18 — End: 2022-01-22

## 2021-09-18 NOTE — Progress Notes (Signed)
OFFICE VISIT  09/18/2021  CC:  Chief Complaint  Patient presents with   Weight management   Patient is a 46 y.o. male who presents for 28-month follow-up weight management, prediabetes, hepatic steatosis. A/P as of last visit: "Weight management, prediabetes, class I obesity. 8 pounds of loss on Ozempic 0.25 weekly x1 month. Increase Ozempic to 0.5 mg subcu weekly and we will keep him on this dose until I see him again in 2 months. Next hemoglobin a1c will be done at that visit."  INTERIM HX: Isabel feels great.    He has been on Ozempic for 4 months now.  He got wellness screening labs through his employer on 09/14/2021 that he brings in for my review today: Glucose 111, hemoglobin A1c 5.4%, lipid panel normal--including LDL of 71. Complete metabolic panel normal, CBC normal.  PMP AWARE reviewed today: most recent rx for lorazepam was filled 08/21/2021, #90, rx by me. No red flags.   Past Medical History:  Diagnosis Date   BPH with obstruction/lower urinary tract symptoms 2018   Pt declined bph med as of 06/2016   Cutaneous abscess of face 04/2017   Disequilibrium syndrome    Elevated blood pressure reading without diagnosis of hypertension    Elevated transaminase level 09/10/2016   [facility] wellness labs--AST 41, ALT 76.   Viral Hep screening NEG.   GAD (generalized anxiety disorder)    Hay fever    Hepatic steatosis    u/s 09/2016   Prediabetes    via work labs 2017/18; A1c 6.2% here 05/2016.  Improved to 5.7% 08/2016.  Then, A1c via [facility] on 09/10/16 was 6.1%.  A1c 6.2% July 2019-->TLC. 6.2% 07/2019. 5.9% 09/2020    Past Surgical History:  Procedure Laterality Date   HOLTER MONITOR  09/2016   NSR with occ sinus brady, rare PVC.    Outpatient Medications Prior to Visit  Medication Sig Dispense Refill   aspirin 81 MG chewable tablet Chew 1 tablet (81 mg total) by mouth daily. 30 tablet 1   LORazepam (ATIVAN) 0.5 MG tablet 1-2 tabs po bid prn anxiety/stress 90 tablet 1    Semaglutide,0.25 or 0.5MG /DOS, (OZEMPIC, 0.25 OR 0.5 MG/DOSE,) 2 MG/3ML SOPN INJECT 0.5 MG SUBCUTANEOUSLY EVERY WEEK 3 mL 0   ALBUTEROL IN Inhale into the lungs. (Patient not taking: Reported on 09/18/2021)     No facility-administered medications prior to visit.    No Known Allergies  ROS As per HPI  PE:    09/18/2021    1:17 PM 06/18/2021    2:54 PM 05/14/2021    8:12 AM  Vitals with BMI  Height 5\' 11"  5\' 11"  5\' 11"   Weight 211 lbs 10 oz 215 lbs 6 oz 223 lbs 10 oz  BMI 29.53 30.06 31.2  Systolic 112 109  Diastolic 76 68 87  Pulse 81 75 79   Physical Exam  Gen: Alert, well appearing.  Patient is oriented to person, place, time, and situation. AFFECT: pleasant, lucid thought and speech. No further exam today.  LABS:  Last CBC Lab Results  Component Value Date   WBC 5.7 09/14/2021   HGB 16.0 09/14/2021   HCT 47 09/14/2021   MCV 90.0 05/14/2021   MCH 30.2 07/22/2016   RDW 13.1 05/14/2021   PLT 249 09/14/2021   Last metabolic panel Lab Results  Component Value Date   GLUCOSE 97 05/14/2021   NA 141 09/14/2021   K 4.0 09/14/2021   CL 105 09/14/2021   CO2 29  05/14/2021   BUN 11 09/14/2021   CREATININE 0.9 09/14/2021   GFRNONAA 112 09/16/2020   CALCIUM 9.5 05/14/2021   PROT 7.4 05/14/2021   ALBUMIN 4.3 09/14/2021   BILITOT 0.8 05/14/2021   ALKPHOS 52 09/14/2021   AST 25 09/14/2021   ALT 42 (A) 09/14/2021   ANIONGAP 8 07/22/2016   Last lipids Lab Results  Component Value Date   CHOL 134 09/14/2021   HDL 45 09/14/2021   LDLCALC 71 09/14/2021   TRIG 95 09/14/2021   CHOLHDL 3 05/14/2021   Last hemoglobin A1c Lab Results  Component Value Date   HGBA1C 5.4 09/14/2021   IMPRESSION AND PLAN:  Prediabetes, weight management, hepatic steatosis. Doing great on Ozempic 0.5 mg weekly---has lost 12 pounds in the last 4 months. Hemoglobin A1c improved from 6.4% to 5.4% in the last 4 months. Normal transaminases.  Plan is to continue Ozempic 0.5 mg every 7  days.  An After Visit Summary was printed and given to the patient.  FOLLOW UP: Return in about 4 months (around 01/18/2022) for annual CPE (fasting).  Signed:  Santiago Bumpers, MD           09/18/2021

## 2021-10-13 ENCOUNTER — Other Ambulatory Visit: Payer: Self-pay | Admitting: Family Medicine

## 2021-10-13 NOTE — Telephone Encounter (Signed)
Requesting: Lorazepam 0.5mg   Contract: 05/14/21 UDS: 05/14/21  Last Visit: 09/18/21 Next Visit: 01/22/22 Last Refill: 05/14/21(90,1)  Please Advise. Medication pending

## 2021-11-12 ENCOUNTER — Other Ambulatory Visit: Payer: Self-pay | Admitting: Family Medicine

## 2022-01-21 NOTE — Patient Instructions (Signed)
Health Maintenance, Male Adopting a healthy lifestyle and getting preventive care are important in promoting health and wellness. Ask your health care provider about: The right schedule for you to have regular tests and exams. Things you can do on your own to prevent diseases and keep yourself healthy. What should I know about diet, weight, and exercise? Eat a healthy diet  Eat a diet that includes plenty of vegetables, fruits, low-fat dairy products, and lean protein. Do not eat a lot of foods that are high in solid fats, added sugars, or sodium. Maintain a healthy weight Body mass index (BMI) is a measurement that can be used to identify possible weight problems. It estimates body fat based on height and weight. Your health care provider can help determine your BMI and help you achieve or maintain a healthy weight. Get regular exercise Get regular exercise. This is one of the most important things you can do for your health. Most adults should: Exercise for at least 150 minutes each week. The exercise should increase your heart rate and make you sweat (moderate-intensity exercise). Do strengthening exercises at least twice a week. This is in addition to the moderate-intensity exercise. Spend less time sitting. Even light physical activity can be beneficial. Watch cholesterol and blood lipids Have your blood tested for lipids and cholesterol at 44 years of age, then have this test every 5 years. You may need to have your cholesterol levels checked more often if: Your lipid or cholesterol levels are high. You are older than 44 years of age. You are at high risk for heart disease. What should I know about cancer screening? Many types of cancers can be detected early and may often be prevented. Depending on your health history and family history, you may need to have cancer screening at various ages. This may include screening for: Colorectal cancer. Prostate cancer. Skin cancer. Lung  cancer. What should I know about heart disease, diabetes, and high blood pressure? Blood pressure and heart disease High blood pressure causes heart disease and increases the risk of stroke. This is more likely to develop in people who have high blood pressure readings or are overweight. Talk with your health care provider about your target blood pressure readings. Have your blood pressure checked: Every 3-5 years if you are 18-39 years of age. Every year if you are 40 years old or older. If you are between the ages of 65 and 75 and are a current or former smoker, ask your health care provider if you should have a one-time screening for abdominal aortic aneurysm (AAA). Diabetes Have regular diabetes screenings. This checks your fasting blood sugar level. Have the screening done: Once every three years after age 45 if you are at a normal weight and have a low risk for diabetes. More often and at a younger age if you are overweight or have a high risk for diabetes. What should I know about preventing infection? Hepatitis B If you have a higher risk for hepatitis B, you should be screened for this virus. Talk with your health care provider to find out if you are at risk for hepatitis B infection. Hepatitis C Blood testing is recommended for: Everyone born from 1945 through 1965. Anyone with known risk factors for hepatitis C. Sexually transmitted infections (STIs) You should be screened each year for STIs, including gonorrhea and chlamydia, if: You are sexually active and are younger than 44 years of age. You are older than 44 years of age and your   health care provider tells you that you are at risk for this type of infection. Your sexual activity has changed since you were last screened, and you are at increased risk for chlamydia or gonorrhea. Ask your health care provider if you are at risk. Ask your health care provider about whether you are at high risk for HIV. Your health care provider  may recommend a prescription medicine to help prevent HIV infection. If you choose to take medicine to prevent HIV, you should first get tested for HIV. You should then be tested every 3 months for as long as you are taking the medicine. Follow these instructions at home: Alcohol use Do not drink alcohol if your health care provider tells you not to drink. If you drink alcohol: Limit how much you have to 0-2 drinks a day. Know how much alcohol is in your drink. In the [place] one drink equals one 12 oz bottle of beer (355 mL), one 5 oz glass of wine (148 mL), or one 1 oz glass of hard liquor (44 mL). Lifestyle Do not use any products that contain nicotine or tobacco. These products include cigarettes, chewing tobacco, and vaping devices, such as e-cigarettes. If you need help quitting, ask your health care provider. Do not use street drugs. Do not share needles. Ask your health care provider for help if you need support or information about quitting drugs. General instructions Schedule regular health, dental, and eye exams. Stay current with your vaccines. Tell your health care provider if: You often feel depressed. You have ever been abused or do not feel safe at home. Summary Adopting a healthy lifestyle and getting preventive care are important in promoting health and wellness. Follow your health care provider's instructions about healthy diet, exercising, and getting tested or screened for diseases. Follow your health care provider's instructions on monitoring your cholesterol and blood pressure. This information is not intended to replace advice given to you by your health care provider. Make sure you discuss any questions you have with your health care provider. Document Revised: 06/23/2020 Document Reviewed: 06/23/2020 Elsevier Patient Education  2023 Elsevier Inc.  

## 2022-01-22 ENCOUNTER — Ambulatory Visit (INDEPENDENT_AMBULATORY_CARE_PROVIDER_SITE_OTHER): Payer: BC Managed Care – PPO | Admitting: Family Medicine

## 2022-01-22 ENCOUNTER — Encounter: Payer: Self-pay | Admitting: Family Medicine

## 2022-01-22 VITALS — BP 117/78 | HR 73 | Temp 97.9°F | Ht 72.0 in | Wt 207.8 lb

## 2022-01-22 DIAGNOSIS — Z23 Encounter for immunization: Secondary | ICD-10-CM

## 2022-01-22 DIAGNOSIS — Z8659 Personal history of other mental and behavioral disorders: Secondary | ICD-10-CM

## 2022-01-22 DIAGNOSIS — F418 Other specified anxiety disorders: Secondary | ICD-10-CM

## 2022-01-22 DIAGNOSIS — R7303 Prediabetes: Secondary | ICD-10-CM | POA: Diagnosis not present

## 2022-01-22 DIAGNOSIS — Z0000 Encounter for general adult medical examination without abnormal findings: Secondary | ICD-10-CM | POA: Diagnosis not present

## 2022-01-22 DIAGNOSIS — Z1211 Encounter for screening for malignant neoplasm of colon: Secondary | ICD-10-CM

## 2022-01-22 LAB — COMPREHENSIVE METABOLIC PANEL
ALT: 27 U/L (ref 0–53)
AST: 17 U/L (ref 0–37)
Albumin: 4.5 g/dL (ref 3.5–5.2)
Alkaline Phosphatase: 43 U/L (ref 39–117)
BUN: 13 mg/dL (ref 6–23)
CO2: 35 mEq/L — ABNORMAL HIGH (ref 19–32)
Calcium: 9.6 mg/dL (ref 8.4–10.5)
Chloride: 103 mEq/L (ref 96–112)
Creatinine, Ser: 0.86 mg/dL (ref 0.40–1.50)
GFR: 105 mL/min (ref >60.00)
Glucose, Bld: 96 mg/dL (ref 70–99)
Potassium: 4.8 mEq/L (ref 3.5–5.1)
Sodium: 141 mEq/L (ref 135–145)
Total Bilirubin: 0.6 mg/dL (ref 0.2–1.2)
Total Protein: 7 g/dL (ref 6.0–8.3)

## 2022-01-22 LAB — LIPID PANEL
Cholesterol: 130 mg/dL (ref 0–200)
HDL: 44.9 mg/dL (ref >39.00)
LDL Cholesterol: 76 mg/dL (ref 0–99)
NonHDL: 85.54
Total CHOL/HDL Ratio: 3
Triglycerides: 46 mg/dL (ref 0.0–149.0)
VLDL: 9.2 mg/dL (ref 0.0–40.0)

## 2022-01-22 LAB — HEMOGLOBIN A1C: Hgb A1c MFr Bld: 5.9 % (ref 4.6–6.5)

## 2022-01-22 MED ORDER — OZEMPIC (0.25 OR 0.5 MG/DOSE) 2 MG/3ML ~~LOC~~ SOPN: PEN_INJECTOR | SUBCUTANEOUS | 3 refills | Status: DC

## 2022-01-22 MED ORDER — LORAZEPAM 0.5 MG PO TABS: ORAL_TABLET | ORAL | 1 refills | Status: DC

## 2022-01-22 NOTE — Progress Notes (Signed)
OFFICE VISIT  01/22/2022  CC:  Chief Complaint  Patient presents with   Annual Exam    Patient is a 46 y.o. male who presents for annual health maintenance exam and 38-monthfollow-up prediabetes and weight management. A/P as of last visit: "Prediabetes, weight management, hepatic steatosis. Doing great on Ozempic 0.5 mg weekly---has lost 12 pounds in the last 4 months. Hemoglobin A1c improved from 6.4% to 5.4% in the last 4 months. Normal transaminases. Plan is to continue Ozempic 0.5 mg every 7 days."  INTERIM HX: Feels very well. Ozempic 0.5 subcu weekly. He continues to lose weight gradually--another 4 pounds since last follow-up.  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  PMP AWARE reviewed today: most recent rx for lorazepam was filled 10/14/2021, # [phone] rx by me. No red flags.   Past Medical History:  Diagnosis Date   BPH with obstruction/lower urinary tract symptoms 2018   Pt declined bph med as of 06/2016   Cutaneous abscess of face 04/2017   Disequilibrium syndrome    Elevated blood pressure reading without diagnosis of hypertension    Elevated transaminase level 09/10/2016   [facility] wellness labs--AST 41, ALT 76.   Viral Hep screening NEG.   GAD (generalized anxiety disorder)    Hay fever    Hepatic steatosis    u/s 09/2016   Prediabetes    via work labs 2017/18; A1c 6.2% here 05/2016.  Improved to 5.7% 08/2016.  Then, A1c via [facility] on 09/10/16 was 6.1%.  A1c 6.2% July 2019-->TLC. 6.2% 07/2019. 5.9% 09/2020    Past Surgical History:  Procedure Laterality Date   HOLTER MONITOR  09/2016   NSR with occ sinus brady, rare PVC.   Family History  Problem Relation Age of Onset   Diabetes Father    Alcohol abuse Father    Mental illness Father        Bipolar + schizophrenia   Diabetes Maternal Grandmother    Pancreatic cancer Maternal Grandfather    Social History   Socioeconomic  History   Marital status: Married    Spouse name: Not on file   Number of children: Not on file   Years of education: Not on file   Highest education level: Bachelor's degree (e.g., BA, AB, BS)  Occupational History   Not on file  Tobacco Use   Smoking status: Former    Packs/day: 0.25    Years: 15.00    Total pack years: 3.75    Types: Cigarettes    Quit date: 02/15/2013    Years since quitting: 8.9   Smokeless tobacco: Former    Types: Chew    Quit date: 02/15/2014  Substance and Sexual Activity   Alcohol use: No   Drug use: No   Sexual activity: Not on file  Other Topics Concern   Not on file  Social History Narrative   Married, 5 children.     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   Occup: [employer]   No tobacco as of 2016.  5 pack-yr hx prior.  Hx of dipping snuff as well.   Social Determinants of Health   Financial Resource Strain: Low Risk  (05/11/2021)   Overall Financial Resource Strain (CARDIA)    Difficulty of Paying Living Expenses: Not hard at all  Food Insecurity: No Food Insecurity (05/11/2021)   Hunger Vital Sign  Worried About Charity fundraiser in the Last Year: Never true    [place] in the Last Year: Never true  Transportation Needs: No Transportation Needs (05/11/2021)   PRAPARE - Hydrologist (Medical): No    Lack of Transportation (Non-Medical): No  Physical Activity: Sufficiently Active (05/11/2021)   Exercise Vital Sign    Days of Exercise per Week: 7 days    Minutes of Exercise per Session: 30 min  Stress: No Stress Concern Present (05/11/2021)   [place]    Feeling of Stress : Only a little  Social Connections: Moderately Integrated (05/11/2021)   Social Connection and Isolation Panel [NHANES]    Frequency of Communication with Friends and Family: Once a week    Frequency of Social  Gatherings with Friends and Family: Once a week    Attends Religious Services: More than 4 times per year    Active Member of [employer] or Organizations: Yes    Attends Music therapist: More than 4 times per year    Marital Status: Married   Outpatient Medications Prior to Visit  Medication Sig Dispense Refill   aspirin 81 MG chewable tablet Chew 1 tablet (81 mg total) by mouth daily. 30 tablet 1   LORazepam (ATIVAN) 0.5 MG tablet TAKE 1 TO 2 TABLETS BY MOUTH TWICE A DAY AS NEEDED FOR ANXIETY/STRESS 90 tablet 1   Semaglutide,0.25 or 0.5MG/DOS, (OZEMPIC, 0.25 OR 0.5 MG/DOSE,) 2 MG/3ML SOPN INJECT 0.5 MG SUBCUTANEOUSLY EVERY WEEK 9 mL 3   ALBUTEROL IN Inhale into the lungs. (Patient not taking: Reported on 09/18/2021)     No facility-administered medications prior to visit.    No Known Allergies  ROS As per HPI  PE:    01/22/2022    8:32 AM 09/18/2021    1:17 PM 06/18/2021    2:54 PM  Vitals with BMI  Height _0  _1  _2   Weight 207 lbs 13 oz 211 lbs 10 oz 215 lbs 6 oz  BMI 28.18 28.78 67.67  Systolic 209 470 962  Diastolic 78 76 68  Pulse 73 81 75     Physical Exam  Gen: Alert, well appearing.  Patient is oriented to person, place, time, and situation. AFFECT: pleasant, lucid thought and speech. No further exam today.  LABS:  Last CBC Lab Results  Component Value Date   WBC 5.7 09/14/2021   HGB 16.0 09/14/2021   HCT 47 09/14/2021   MCV 90.0 05/14/2021   MCH 30.2 07/22/2016   RDW 13.1 05/14/2021   PLT 249 83/66/2947   Last metabolic panel Lab Results  Component Value Date   GLUCOSE 97 05/14/2021   NA 141 09/14/2021   K 4.0 09/14/2021   CL 105 09/14/2021   CO2 29 05/14/2021   BUN 11 09/14/2021   CREATININE 0.9 09/14/2021   EGFR 109 09/14/2021   CALCIUM 9.5 05/14/2021   PROT 7.4 05/14/2021   ALBUMIN 4.3 09/14/2021   BILITOT 0.8 05/14/2021   ALKPHOS 52 09/14/2021   AST 25 09/14/2021   ALT 42 (A) 09/14/2021   ANIONGAP 8 07/22/2016   Last  lipids Lab Results  Component Value Date   CHOL 134 09/14/2021   HDL 45 09/14/2021   LDLCALC 71 09/14/2021   TRIG 95 09/14/2021   CHOLHDL 3 05/14/2021   Last hemoglobin A1c Lab Results  Component Value Date   HGBA1C 5.4 09/14/2021  IMPRESSION AND PLAN:  #1 health maintenance exam: Reviewed age and gender appropriate health maintenance issues (prudent diet, regular exercise, health risks of tobacco and excessive alcohol, use of seatbelts, fire alarms in home, use of sunscreen).  Also reviewed age and gender appropriate health screening as well as vaccine recommendations. Vaccines: all utd Labs: cmet, flp, a1c Prostate ca screening: average risk patient= as per latest guidelines, start screening at 55 yrs of age. Colon ca screening: average risk patient= as per latest guidelines, due for screening-->GI referral today.  #2 prediabetes, weight management. Great results with Ozempic 0.5 mg subcu weekly.  Continue. Hemoglobin A1c today.  #3 anxiety, situational.  History of panic attack. Doing well with as needed use of lorazepam 0.5, 1-2 twice daily as needed.  An After Visit Summary was printed and given to the patient.  FOLLOW UP: Return in about 6 months (around 07/24/2022) for routine chronic illness f/u.  Signed:  Crissie Sickles, MD           01/22/2022

## 2022-02-12 DIAGNOSIS — J019 Acute sinusitis, unspecified: Secondary | ICD-10-CM | POA: Diagnosis not present

## 2022-02-12 DIAGNOSIS — Z03818 Encounter for observation for suspected exposure to other biological agents ruled out: Secondary | ICD-10-CM | POA: Diagnosis not present

## 2022-04-30 ENCOUNTER — Telehealth: Payer: Self-pay

## 2022-04-30 ENCOUNTER — Encounter: Payer: Self-pay | Admitting: Family Medicine

## 2022-04-30 ENCOUNTER — Ambulatory Visit: Payer: BC Managed Care – PPO | Admitting: Family Medicine

## 2022-04-30 VITALS — BP 133/95 | HR 77 | Temp 98.3°F | Ht 72.0 in | Wt 201.0 lb

## 2022-04-30 DIAGNOSIS — K760 Fatty (change of) liver, not elsewhere classified: Secondary | ICD-10-CM | POA: Diagnosis not present

## 2022-04-30 DIAGNOSIS — R7303 Prediabetes: Secondary | ICD-10-CM | POA: Diagnosis not present

## 2022-04-30 DIAGNOSIS — Z7689 Persons encountering health services in other specified circumstances: Secondary | ICD-10-CM

## 2022-04-30 MED ORDER — SEMAGLUTIDE(0.25 OR 0.5MG/DOS) 2 MG/3ML ~~LOC~~ SOPN: 0.5000 mg | PEN_INJECTOR | SUBCUTANEOUS | 0 refills | Status: DC

## 2022-04-30 NOTE — Addendum Note (Signed)
Addended by: Deveron Furlong D on: 04/30/2022 08:46 AM   Modules accepted: Orders

## 2022-04-30 NOTE — Telephone Encounter (Signed)
PA sent via covermymed on 04/30/22   Key: [id number]   Medication: Ozempic (0.25/0.5mg ) 2mg /66ml sopn   Dx: Prediabetes, Hepatic steatosis   Per Dr. Anitra Lauth pt has tried and failed N/A   Waiting for response.

## 2022-04-30 NOTE — Progress Notes (Signed)
OFFICE VISIT  04/30/2022  CC:  Chief Complaint  Patient presents with   Weight Management    Patient is a 47 y.o. Page who presents for 58-month follow-up weight management (comorbid prediab and hepatic steatosis). A/P as of last visit: "2 prediabetes, weight management. Great results with Ozempic 0.5 mg subcu weekly.  Continue. Hemoglobin A1c today."  INTERIM HX: Doing well. No adverse effects.  We're working on getting prior auth done for [employer]. Last injection was about 10 d/a.  Past Medical History:  Diagnosis Date   BPH with obstruction/lower urinary tract symptoms 2018   Pt declined bph med as of 06/2016   Cutaneous abscess of face 04/2017   Disequilibrium syndrome    Elevated blood pressure reading without diagnosis of hypertension    Elevated transaminase level 09/10/2016   [facility] wellness labs--AST 41, ALT 76.   Viral Hep screening NEG.   GAD (generalized anxiety disorder)    Hay fever    Hepatic steatosis    u/s 09/2016   Prediabetes    via work labs 2017/18; A1c 6.2% here 05/2016.  Improved to 5.7% 08/2016.  Then, A1c via [facility] on 09/10/16 was 6.1%.  A1c 6.2% July 2019-->TLC. 6.2% 07/2019. 5.9% 09/2020    Past Surgical History:  Procedure Laterality Date   HOLTER MONITOR  09/2016   NSR with occ sinus brady, rare PVC.    Outpatient Medications Prior to Visit  Medication Sig Dispense Refill   aspirin 81 MG chewable tablet Chew 1 tablet (81 mg total) by mouth daily. 30 tablet 1   Semaglutide,0.25 or 0.5MG /DOS, (OZEMPIC, 0.25 OR 0.5 MG/DOSE,) 2 MG/3ML SOPN INJECT 0.5 MG SUBCUTANEOUSLY EVERY WEEK 9 mL 3   LORazepam (ATIVAN) 0.5 MG tablet TAKE 1 TO 2 TABLETS BY MOUTH TWICE A DAY AS NEEDED FOR ANXIETY/STRESS (Patient not taking: Reported on 04/30/2022) 90 tablet 1   No facility-administered medications prior to visit.    No Known Allergies  Review of Systems As per HPI  PE:    04/30/2022    8:18 AM 01/22/2022    8:32 AM 09/18/2021    1:17 PM  Vitals with  BMI  Height 6\' 0"  6\' 0"  5\' 11"   Weight 201 lbs 207 lbs 13 oz 211 lbs 10 oz  BMI 27.25 [id number]  Systolic Q000111Q [id number]  Diastolic 95 78 76  Pulse 77 73 81    Physical Exam  Gen: Alert, well appearing.  Patient is oriented to person, place, time, and situation. No further exam today.  LABS:  Last CBC Lab Results  Component Value Date   WBC 5.7 09/14/2021   HGB 16.0 09/14/2021   HCT 47 09/14/2021   MCV 90.0 05/14/2021   MCH 30.2 07/22/2016   RDW 13.1 05/14/2021   PLT 249 [id number]   Last metabolic panel Lab Results  Component Value Date   GLUCOSE 96 01/22/2022   NA 141 01/22/2022   K 4.8 01/22/2022   CL 103 01/22/2022   CO2 35 (H) 01/22/2022   BUN 13 01/22/2022   CREATININE 0.86 01/22/2022   EGFR 109 09/14/2021   CALCIUM 9.6 01/22/2022   PROT 7.0 01/22/2022   ALBUMIN 4.5 01/22/2022   BILITOT 0.6 01/22/2022   ALKPHOS 43 01/22/2022   AST 17 01/22/2022   ALT 27 01/22/2022   ANIONGAP 8 07/22/2016   Last lipids Lab Results  Component Value Date   CHOL 130 01/22/2022   HDL 44.90 01/22/2022   LDLCALC 76 01/22/2022   TRIG 46.0  01/22/2022   CHOLHDL 3 01/22/2022   Last hemoglobin A1c Lab Results  Component Value Date   HGBA1C 5.9 01/22/2022   Last thyroid functions Lab Results  Component Value Date   TSH 1.36 05/14/2021   IMPRESSION AND PLAN:  Wt mgmt (comorbidities-->prediabetes and hepatic steatosis). Continue ozempic 0.5mg  SQ q week. No labs needed.  An After Visit Summary was printed and given to the patient.  FOLLOW UP: Return for has appt for June.->rpt A1c at that time. Next CPE 01/2023  Signed:  Crissie Sickles, MD           04/30/2022

## 2022-05-05 DIAGNOSIS — D125 Benign neoplasm of sigmoid colon: Secondary | ICD-10-CM | POA: Diagnosis not present

## 2022-05-05 DIAGNOSIS — Z1211 Encounter for screening for malignant neoplasm of colon: Secondary | ICD-10-CM | POA: Diagnosis not present

## 2022-05-05 DIAGNOSIS — D123 Benign neoplasm of transverse colon: Secondary | ICD-10-CM | POA: Diagnosis not present

## 2022-05-05 DIAGNOSIS — K635 Polyp of colon: Secondary | ICD-10-CM | POA: Diagnosis not present

## 2022-05-05 DIAGNOSIS — D122 Benign neoplasm of ascending colon: Secondary | ICD-10-CM | POA: Diagnosis not present

## 2022-05-05 LAB — HM COLONOSCOPY

## 2022-05-17 NOTE — Telephone Encounter (Signed)
05/06/22 response:   We denied your request because we did not see what we need to approve the drug you asked for, (Ozempic). We may be able to approve this drug for this illness (type 2 diabetes mellitus). If you do have this illness, we may need more information. We based this decision on your health plan's prior authorization clinical criteria named GlucagonLike Peptide-1 (GLP-1) Receptor Agonist and Glucose-Dependent Insulinotropic Polypeptide (GIP)/Glucagon-Like Peptide-1 (GLP-1) Receptor Agonist.

## 2022-05-18 ENCOUNTER — Ambulatory Visit: Payer: BC Managed Care – PPO | Admitting: Family Medicine

## 2022-05-18 ENCOUNTER — Encounter: Payer: Self-pay | Admitting: Family Medicine

## 2022-05-18 VITALS — BP 134/85 | HR 79 | Ht 72.0 in | Wt 200.4 lb

## 2022-05-18 DIAGNOSIS — E663 Overweight: Secondary | ICD-10-CM | POA: Diagnosis not present

## 2022-05-18 DIAGNOSIS — R7303 Prediabetes: Secondary | ICD-10-CM

## 2022-05-18 DIAGNOSIS — Z7689 Persons encountering health services in other specified circumstances: Secondary | ICD-10-CM

## 2022-05-18 LAB — GLUCOSE, RANDOM: Glucose, Bld: 106 mg/dL — ABNORMAL HIGH (ref 70–99)

## 2022-05-18 LAB — HEMOGLOBIN A1C: Hgb A1c MFr Bld: 5.7 % (ref 4.6–6.5)

## 2022-05-18 NOTE — Progress Notes (Signed)
OFFICE VISIT  05/18/2022  CC:  Chief Complaint  Patient presents with   Follow-up    Follow up on weight lost meds and side effects. He is fasting and also wants to discuss colonoscopy results.    Patient is a 47 y.o. male who presents for discussion of weight management medications. I last saw him about 2 weeks ago and he had been doing well on Ozempic for about the previous 10 months.  However, now his insurance denies coverage for this medication.  INTERIM HX: Feels well. He is unsure did not improve Ozempic or any other GLP-1 agonist because he does not have a diagnosis of type 2 diabetes. He is prediabetic. He does have some intermittent feeling of polyuria and some brief and mild blurred vision.  Says this is usually after he eats large meals.  Past Medical History:  Diagnosis Date   BPH with obstruction/lower urinary tract symptoms 2018   Pt declined bph med as of 06/2016   Cutaneous abscess of face 04/2017   Disequilibrium syndrome    Elevated blood pressure reading without diagnosis of hypertension    Elevated transaminase level 09/10/2016   [facility] wellness labs--AST 41, ALT 76.   Viral Hep screening NEG.   GAD (generalized anxiety disorder)    Hay fever    Hepatic steatosis    u/s 09/2016   Prediabetes    via work labs 2017/18; A1c 6.2% here 05/2016.  Improved to 5.7% 08/2016.  Then, A1c via [facility] on 09/10/16 was 6.1%.  A1c 6.2% July 2019-->TLC. 6.2% 07/2019. 5.9% 09/2020    Past Surgical History:  Procedure Laterality Date   HOLTER MONITOR  09/2016   NSR with occ sinus brady, rare PVC.    Outpatient Medications Prior to Visit  Medication Sig Dispense Refill   aspirin 81 MG chewable tablet Chew 1 tablet (81 mg total) by mouth daily. 30 tablet 1   LORazepam (ATIVAN) 0.5 MG tablet TAKE 1 TO 2 TABLETS BY MOUTH TWICE A DAY AS NEEDED FOR ANXIETY/STRESS 90 tablet 1   Semaglutide,0.25 or 0.5MG /DOS, (OZEMPIC, 0.25 OR 0.5 MG/DOSE,) 2 MG/3ML SOPN INJECT 0.5 MG SUBCUTANEOUSLY  EVERY WEEK 9 mL 3   Semaglutide,0.25 or 0.5MG /DOS, 2 MG/3ML SOPN Inject 0.5 mg into the skin once a week. (Patient not taking: Reported on 05/18/2022) 3 mL 0   No facility-administered medications prior to visit.    No Known Allergies  Review of Systems As per HPI  PE:    05/18/2022    8:22 AM 04/30/2022    8:18 AM 01/22/2022    8:32 AM  Vitals with BMI  Height  6\' 0"  6\' 0"   Weight 200 lbs 6 oz 201 lbs 207 lbs 13 oz  BMI 27.17 [id number]  Systolic Q000111Q Q000111Q [id number]  Diastolic 85 95 78  Pulse 79 77 73     Physical Exam  Gen: Alert, well appearing.  Patient is oriented to person, place, time, and situation. AFFECT: pleasant, lucid thought and speech. No further exam today  LABS:  Last CBC Lab Results  Component Value Date   WBC 5.7 09/14/2021   HGB 16.0 09/14/2021   HCT 47 09/14/2021   MCV 90.0 05/14/2021   MCH 30.2 07/22/2016   RDW 13.1 05/14/2021   PLT 249 [id number]   Last metabolic panel Lab Results  Component Value Date   GLUCOSE 96 01/22/2022   NA 141 01/22/2022   K 4.8 01/22/2022   CL 103 01/22/2022   CO2 35 (H)  01/22/2022   BUN 13 01/22/2022   CREATININE 0.86 01/22/2022   EGFR 109 09/14/2021   CALCIUM 9.6 01/22/2022   PROT 7.0 01/22/2022   ALBUMIN 4.5 01/22/2022   BILITOT 0.6 01/22/2022   ALKPHOS 43 01/22/2022   AST 17 01/22/2022   ALT 27 01/22/2022   ANIONGAP 8 07/22/2016   Last lipids Lab Results  Component Value Date   CHOL 130 01/22/2022   HDL 44.90 01/22/2022   LDLCALC 76 01/22/2022   TRIG 46.0 01/22/2022   CHOLHDL 3 01/22/2022   Last hemoglobin A1c Lab Results  Component Value Date   HGBA1C 5.9 01/22/2022   IMPRESSION AND PLAN:  Weight management: Patient with diagnosis of overweight and prediabetes.  Had been doing well on Ozempic but insurer now denies coverage because he does not have diagnosis of type 2 diabetes. We will recheck fasting glucose and hemoglobin A1c today. No new medications started today.  An After Visit Summary  was printed and given to the patient.  FOLLOW UP: Keep follow-up in June this year Next cpe 01/2023 Signed:  Crissie Sickles, MD           05/18/2022

## 2022-06-30 ENCOUNTER — Encounter: Payer: Self-pay | Admitting: Family Medicine

## 2022-06-30 ENCOUNTER — Ambulatory Visit: Payer: BC Managed Care – PPO | Admitting: Family Medicine

## 2022-06-30 VITALS — BP 129/87 | HR 83 | Wt 193.8 lb

## 2022-06-30 DIAGNOSIS — R7303 Prediabetes: Secondary | ICD-10-CM | POA: Diagnosis not present

## 2022-06-30 DIAGNOSIS — Z7689 Persons encountering health services in other specified circumstances: Secondary | ICD-10-CM

## 2022-06-30 DIAGNOSIS — E663 Overweight: Secondary | ICD-10-CM

## 2022-06-30 MED ORDER — SEMAGLUTIDE(0.25 OR 0.5MG/DOS) 2 MG/3ML ~~LOC~~ SOPN: 0.5000 mg | PEN_INJECTOR | SUBCUTANEOUS | 0 refills | Status: DC

## 2022-06-30 MED ORDER — OZEMPIC (0.25 OR 0.5 MG/DOSE) 2 MG/3ML ~~LOC~~ SOPN: PEN_INJECTOR | SUBCUTANEOUS | 3 refills | Status: DC

## 2022-06-30 NOTE — Progress Notes (Signed)
OFFICE VISIT  06/30/2022  CC:  Chief Complaint  Patient presents with   Discuss meds    Patient is a 47 y.o. male who presents for 6 wk follow-up weight management/overweight. A/P as of last visit: "Weight management: Patient with diagnosis of overweight, hepatic steatosis, and prediabetes.  Had been doing well on Ozempic but insurer now denies coverage because he does not have diagnosis of type 2 diabetes. We will recheck fasting glucose and hemoglobin A1c today. No new medications started today."  INTERIM HX: Joshua Page feels well.  His insurance will not approve Ozempic for treatment of overweight or prediabetes.   PMP AWARE reviewed today: most recent rx for lorazepam 0.5 mg was filled 06/19/2022, # 90, rx by me. No red flags.  Past Medical History:  Diagnosis Date   BPH with obstruction/lower urinary tract symptoms 2018   Pt declined bph med as of 06/2016   Cutaneous abscess of face 04/2017   Disequilibrium syndrome    Elevated blood pressure reading without diagnosis of hypertension    Elevated transaminase level 09/10/2016   [facility] wellness labs--AST 41, ALT 76.   Viral Hep screening NEG.   GAD (generalized anxiety disorder)    Hay fever    Hepatic steatosis    u/s 09/2016   History of adenomatous polyp of colon    04/2022->recall 5-7 yrs   Prediabetes    via work labs 2017/18; A1c 6.2% here 05/2016.  Improved to 5.7% 08/2016.  Then, A1c via [facility] on 09/10/16 was 6.1%.  A1c 6.2% July 2019-->TLC. 6.2% 07/2019. 5.9% 09/2020    Past Surgical History:  Procedure Laterality Date   COLONOSCOPY W/ POLYPECTOMY     2024 Adenoma x 2-recall 5-7 yrs ([facility])   HOLTER MONITOR  09/2016   NSR with occ sinus brady, rare PVC.    Outpatient Medications Prior to Visit  Medication Sig Dispense Refill   aspirin 81 MG chewable tablet Chew 1 tablet (81 mg total) by mouth daily. 30 tablet 1   LORazepam (ATIVAN) 0.5 MG tablet TAKE 1 TO 2 TABLETS BY MOUTH TWICE A DAY AS NEEDED FOR  ANXIETY/STRESS 90 tablet 1   Semaglutide,0.25 or 0.5MG /DOS, (OZEMPIC, 0.25 OR 0.5 MG/DOSE,) 2 MG/3ML SOPN INJECT 0.5 MG SUBCUTANEOUSLY EVERY WEEK 9 mL 3   Semaglutide,0.25 or 0.5MG /DOS, 2 MG/3ML SOPN Inject 0.5 mg into the skin once a week. (Patient not taking: Reported on 05/18/2022) 3 mL 0   No facility-administered medications prior to visit.    No Known Allergies  Review of Systems As per HPI  PE:    06/30/2022    2:05 PM 05/18/2022    8:22 AM 04/30/2022    8:18 AM  Vitals with BMI  Height  6\' 0"  6\' 0"   Weight 193 lbs 13 oz 200 lbs 6 oz 201 lbs  BMI  27.17 27.25  Systolic 129 134 213  Diastolic 87 85 95  Pulse 83 79 77     Physical Exam  Gen: Alert, well appearing.  Patient is oriented to person, place, time, and situation. AFFECT: pleasant, lucid thought and speech. No further exam today.  LABS:  Last CBC Lab Results  Component Value Date   WBC 5.7 09/14/2021   HGB 16.0 09/14/2021   HCT 47 09/14/2021   MCV 90.0 05/14/2021   MCH 30.2 07/22/2016   RDW 13.1 05/14/2021   PLT 249 09/14/2021   Last metabolic panel Lab Results  Component Value Date   GLUCOSE 106 (H) 05/18/2022  NA 141 01/22/2022   K 4.8 01/22/2022   CL 103 01/22/2022   CO2 35 (H) 01/22/2022   BUN 13 01/22/2022   CREATININE 0.86 01/22/2022   EGFR 109 09/14/2021   CALCIUM 9.6 01/22/2022   PROT 7.0 01/22/2022   ALBUMIN 4.5 01/22/2022   BILITOT 0.6 01/22/2022   ALKPHOS 43 01/22/2022   AST 17 01/22/2022   ALT 27 01/22/2022   ANIONGAP 8 07/22/2016   Last lipids Lab Results  Component Value Date   CHOL 130 01/22/2022   HDL 44.90 01/22/2022   LDLCALC 76 01/22/2022   TRIG 46.0 01/22/2022   CHOLHDL 3 01/22/2022   Last hemoglobin A1c Lab Results  Component Value Date   HGBA1C 5.7 05/18/2022   IMPRESSION AND PLAN:  #1 Weight management: Patient with diagnosis of overweight, hepatic steatosis, and prediabetes.  Had been doing well on Ozempic but this year his insurer denies coverage  because he does not have diagnosis of type 2 diabetes. He is looking into getting the Ozempic through an online compounding source.  Prescription printed today for his Ozempic at the 0.5 mg weekly dosing.  Additionally, 1 sample injector dispensed today. I gave him a list of other GLP-1 agonists to ask his insurer about.  #2 prediabetes: Hemoglobin A1c peaked at 6.4% in March 2023. Since then it has been 5.9% or less, most recently 5.7% six weeks ago.  His daughter is getting married next month and his son is getting married around Thanksgiving this year!  An After Visit Summary was printed and given to the patient.  FOLLOW UP: Return in about 7 months (around 01/30/2023) for annual CPE (fasting). Next cpe 01/2023  Signed:  Santiago Bumpers, MD           06/30/2022

## 2022-06-30 NOTE — Patient Instructions (Signed)
Check with insurer about coverage for:  trulicity, saxenda, wegovy, and mounjaro

## 2022-07-30 ENCOUNTER — Ambulatory Visit: Payer: BC Managed Care – PPO | Admitting: Family Medicine

## 2022-09-14 ENCOUNTER — Telehealth: Payer: Self-pay | Admitting: Family Medicine

## 2022-09-14 NOTE — Telephone Encounter (Signed)
Form in process to be completed, LVM for pt regarding dates.  Note: Form states testing collection has to be in between 02/15/22- 10/15/22. His las physical and labs were completed on 01/22/22.

## 2022-09-14 NOTE — Telephone Encounter (Signed)
Patients wife dropped off Biometric paperwork for Dr. Milinda Cave during her appointment today with Dr. Claiborne Billings.   Patient dropped off document [employer], to be filled out by provider. Patient requested to send it back via Call Patient to pick up within ASAP. Document is located in providers tray at front office.Please advise at Mobile [phone] (mobile)

## 2022-09-14 NOTE — Telephone Encounter (Signed)
Pt returned call and advised of form update. He confirmed his insurance would approve CPE prior to 366 days from last one. Pt scheduled.

## 2022-09-15 ENCOUNTER — Encounter (INDEPENDENT_AMBULATORY_CARE_PROVIDER_SITE_OTHER): Payer: Self-pay

## 2022-09-21 DIAGNOSIS — X32XXXS Exposure to sunlight, sequela: Secondary | ICD-10-CM | POA: Diagnosis not present

## 2022-09-21 DIAGNOSIS — D2371 Other benign neoplasm of skin of right lower limb, including hip: Secondary | ICD-10-CM | POA: Diagnosis not present

## 2022-09-21 DIAGNOSIS — L821 Other seborrheic keratosis: Secondary | ICD-10-CM | POA: Diagnosis not present

## 2022-09-21 DIAGNOSIS — L814 Other melanin hyperpigmentation: Secondary | ICD-10-CM | POA: Diagnosis not present

## 2022-09-29 ENCOUNTER — Telehealth: Payer: Self-pay

## 2022-09-29 DIAGNOSIS — R7303 Prediabetes: Secondary | ICD-10-CM

## 2022-09-29 DIAGNOSIS — Z0000 Encounter for general adult medical examination without abnormal findings: Secondary | ICD-10-CM

## 2022-09-29 NOTE — Patient Instructions (Signed)

## 2022-09-29 NOTE — Telephone Encounter (Signed)
Please Advise

## 2022-09-29 NOTE — Telephone Encounter (Signed)
Patient has CPE scheduled tomorrow at 3pm with Dr. Milinda Cave.  He is requesting to have labs done tomorrow so that he will not have to fast until 3pm.  Please advise patient [phone]

## 2022-09-30 ENCOUNTER — Ambulatory Visit (INDEPENDENT_AMBULATORY_CARE_PROVIDER_SITE_OTHER): Payer: BC Managed Care – PPO | Admitting: Family Medicine

## 2022-09-30 ENCOUNTER — Other Ambulatory Visit (INDEPENDENT_AMBULATORY_CARE_PROVIDER_SITE_OTHER): Payer: BC Managed Care – PPO

## 2022-09-30 ENCOUNTER — Encounter: Payer: Self-pay | Admitting: Family Medicine

## 2022-09-30 VITALS — BP 129/80 | HR 68 | Ht 71.0 in | Wt 192.1 lb

## 2022-09-30 DIAGNOSIS — R7303 Prediabetes: Secondary | ICD-10-CM

## 2022-09-30 DIAGNOSIS — Z0000 Encounter for general adult medical examination without abnormal findings: Secondary | ICD-10-CM

## 2022-09-30 DIAGNOSIS — F419 Anxiety disorder, unspecified: Secondary | ICD-10-CM

## 2022-09-30 LAB — CBC WITH DIFFERENTIAL/PLATELET
Basophils Absolute: 0 10*3/uL (ref 0.0–0.1)
Basophils Relative: 1 % (ref 0.0–3.0)
Eosinophils Absolute: 0.1 10*3/uL (ref 0.0–0.7)
Eosinophils Relative: 3 % (ref 0.0–5.0)
HCT: 48.1 % (ref 39.0–52.0)
Hemoglobin: 15.8 g/dL (ref 13.0–17.0)
Lymphocytes Relative: 32.5 % (ref 12.0–46.0)
Lymphs Abs: 1.6 10*3/uL (ref 0.7–4.0)
MCHC: 32.9 g/dL (ref 30.0–36.0)
MCV: 89.8 fl (ref 78.0–100.0)
Monocytes Absolute: 0.4 10*3/uL (ref 0.1–1.0)
Monocytes Relative: 8.4 % (ref 3.0–12.0)
Neutro Abs: 2.7 10*3/uL (ref 1.4–7.7)
Neutrophils Relative %: 55.1 % (ref 43.0–77.0)
Platelets: 258 10*3/uL (ref 150.0–400.0)
RBC: 5.36 Mil/uL (ref 4.22–5.81)
RDW: 13.7 % (ref 11.5–15.5)
WBC: 4.9 10*3/uL (ref 4.0–10.5)

## 2022-09-30 LAB — COMPREHENSIVE METABOLIC PANEL
ALT: 17 U/L (ref 0–53)
AST: 16 U/L (ref 0–37)
Albumin: 4.8 g/dL (ref 3.5–5.2)
Alkaline Phosphatase: 47 U/L (ref 39–117)
BUN: 14 mg/dL (ref 6–23)
CO2: 32 mEq/L (ref 19–32)
Calcium: 9.8 mg/dL (ref 8.4–10.5)
Chloride: 102 mEq/L (ref 96–112)
Creatinine, Ser: 0.82 mg/dL (ref 0.40–1.50)
GFR: 106.01 mL/min (ref >60.00)
Glucose, Bld: 113 mg/dL — ABNORMAL HIGH (ref 70–99)
Potassium: 4.6 mEq/L (ref 3.5–5.1)
Sodium: 140 mEq/L (ref 135–145)
Total Bilirubin: 0.8 mg/dL (ref 0.2–1.2)
Total Protein: 7.5 g/dL (ref 6.0–8.3)

## 2022-09-30 LAB — LIPID PANEL
Cholesterol: 157 mg/dL (ref 0–200)
HDL: 53.9 mg/dL (ref >39.00)
LDL Cholesterol: 93 mg/dL (ref 0–99)
NonHDL: 103.39
Total CHOL/HDL Ratio: 3
Triglycerides: 54 mg/dL (ref 0.0–149.0)
VLDL: 10.8 mg/dL (ref 0.0–40.0)

## 2022-09-30 LAB — HEMOGLOBIN A1C: Hgb A1c MFr Bld: 5.8 % (ref 4.6–6.5)

## 2022-09-30 LAB — TSH: TSH: 0.85 u[IU]/mL (ref 0.35–5.50)

## 2022-09-30 MED ORDER — OZEMPIC (0.25 OR 0.5 MG/DOSE) 2 MG/3ML ~~LOC~~ SOPN: PEN_INJECTOR | SUBCUTANEOUS | 3 refills | Status: DC

## 2022-09-30 MED ORDER — LORAZEPAM 0.5 MG PO TABS: ORAL_TABLET | ORAL | 1 refills | Status: DC

## 2022-09-30 MED ORDER — SEMAGLUTIDE(0.25 OR 0.5MG/DOS) 2 MG/3ML ~~LOC~~ SOPN: 0.5000 mg | PEN_INJECTOR | SUBCUTANEOUS | Status: DC

## 2022-09-30 NOTE — Telephone Encounter (Signed)
Left pt a vm advising him it is ok to come in earlier for labs

## 2022-09-30 NOTE — Telephone Encounter (Signed)
Yes, this is fine.

## 2022-09-30 NOTE — Progress Notes (Signed)
Office Note 09/30/2022  CC:  Chief Complaint  Patient presents with   Annual Exam    CPE labs done prior    Patient is a 47 y.o. male who is here for annual health maintenance exam and 69-month follow-up GAD, weight management, and prediabetes. A/P as of last visit: "#1 Weight management: Patient with diagnosis of overweight, hepatic steatosis, and prediabetes.  Had been doing well on Ozempic but this year his insurer denies coverage because he does not have diagnosis of type 2 diabetes. He is looking into getting the Ozempic through an online compounding source.  Prescription printed today for his Ozempic at the 0.5 mg weekly dosing.  Additionally, 1 sample injector dispensed today. I gave him a list of other GLP-1 agonists to ask his insurer about.   #2 prediabetes: Hemoglobin A1c peaked at 6.4% in March 2023. Since then it has been 5.9% or less, most recently 5.7% six weeks ago."  INTERIM HX: Joshua Page feels well.   He is maintaining weight on Ozempic 0.5 mg and excellent exercise habits. He swims 1000 yards a day and takes a 30-minute walk daily.   He still uses lorazepam twice daily as needed for anxiety and this helps very well.   PMP AWARE reviewed today: most recent rx for lorazepam was filled 06/19/2022, # 90, rx by me. No red flags.   Past Medical History:  Diagnosis Date   BPH with obstruction/lower urinary tract symptoms 2018   Pt declined bph med as of 06/2016   Cutaneous abscess of face 04/2017   Disequilibrium syndrome    Elevated blood pressure reading without diagnosis of hypertension    Elevated transaminase level 09/10/2016   [facility] wellness labs--AST 41, ALT 76.   Viral Hep screening NEG.   GAD (generalized anxiety disorder)    Hay fever    Hepatic steatosis    u/s 09/2016   History of adenomatous polyp of colon    04/2022->recall 5-7 yrs   Prediabetes    via work labs 2017/18; A1c 6.2% here 05/2016.  Improved to 5.7% 08/2016.  Then, A1c via [facility] on  09/10/16 was 6.1%.  A1c 6.2% July 2019-->TLC. 6.2% 07/2019. 5.9% 09/2020    Past Surgical History:  Procedure Laterality Date   COLONOSCOPY W/ POLYPECTOMY     2024 Adenoma x 2-recall 5-7 yrs ([facility])   HOLTER MONITOR  09/2016   NSR with occ sinus brady, rare PVC.    Family History  Problem Relation Age of Onset   Diabetes Father    Alcohol abuse Father    Mental illness Father        Bipolar + schizophrenia   Diabetes Maternal Grandmother    Pancreatic cancer Maternal Grandfather     Social History   Socioeconomic History   Marital status: Married    Spouse name: Not on file   Number of children: Not on file   Years of education: Not on file   Highest education level: Bachelor's degree (e.g., BA, AB, BS)  Occupational History   Not on file  Tobacco Use   Smoking status: Former    Current packs/day: 0.00    Average packs/day: 0.3 packs/day for 15.0 years (3.8 ttl pk-yrs)    Types: Cigarettes    Start date: 02/15/1998    Quit date: 02/15/2013    Years since quitting: 9.6   Smokeless tobacco: Former    Types: Chew    Quit date: 02/15/2014  Substance and Sexual Activity   Alcohol  use: No   Drug use: No   Sexual activity: Not on file  Other Topics Concern   Not on file  Social History Narrative   Married, 5 children.     Educ: college Surveyor, quantity)   [employer]	[place] in past.   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   No tobacco as of 2016.  5 pack-yr hx prior.  Hx of dipping snuff as well.   Social Determinants of Health   Financial Resource Strain: Low Risk  (05/14/2022)   Overall Financial Resource Strain (CARDIA)    Difficulty of Paying Living Expenses: Not hard at all  Food Insecurity: No Food Insecurity (05/14/2022)   Hunger Vital Sign    Worried About Running Out of Food in the Last Year: Never true    Ran Out of Food in the Last Year: Never true  Transportation Needs: No Transportation Needs (05/14/2022)   PRAPARE - Therapist, art (Medical): No    Lack of Transportation (Non-Medical): No  Physical Activity: Sufficiently Active (05/14/2022)   Exercise Vital Sign    Days of Exercise per Week: 7 days    Minutes of Exercise per Session: 30 min  Stress: No Stress Concern Present (05/14/2022)   [employer] of Occupational Health - Occupational Stress Questionnaire    Feeling of Stress : Only a little  Social Connections: Socially Integrated (05/14/2022)   Social Connection and Isolation Panel [NHANES]    Frequency of Communication with Friends and Family: More than three times a week    Frequency of Social Gatherings with Friends and Family: Once a week    Attends Religious Services: More than 4 times per year    Active Member of [employer] or Organizations: Yes    Attends Engineer, structural: More than 4 times per year    Marital Status: Married  Catering manager Violence: Unknown (02/02/2022)   Received from [employer], [facility]   HITS    Physically Hurt: Not on file    Insult or Talk Down To: Not on file    Threaten Physical Harm: Not on file    Scream or Curse: Not on file    Outpatient Medications Prior to Visit  Medication Sig Dispense Refill   aspirin 81 MG chewable tablet Chew 1 tablet (81 mg total) by mouth daily. 30 tablet 1   LORazepam (ATIVAN) 0.5 MG tablet TAKE 1 TO 2 TABLETS BY MOUTH TWICE A DAY AS NEEDED FOR ANXIETY/STRESS 90 tablet 1   Semaglutide,0.25 or 0.5MG /DOS, (OZEMPIC, 0.25 OR 0.5 MG/DOSE,) 2 MG/3ML SOPN INJECT 0.5 MG SUBCUTANEOUSLY EVERY WEEK 9 mL 3   Semaglutide,0.25 or 0.5MG /DOS, 2 MG/3ML SOPN Inject 0.5 mg into the skin once a week. 3 mL 0   No facility-administered medications prior to visit.    No Known Allergies  Review of Systems  Constitutional:  Negative for appetite change, chills, fatigue and fever.  HENT:  Negative for congestion, dental problem, ear pain and sore throat.   Eyes:  Negative for discharge, redness and visual  disturbance.  Respiratory:  Negative for cough, chest tightness, shortness of breath and wheezing.   Cardiovascular:  Negative for chest pain, palpitations and leg swelling.  Gastrointestinal:  Negative for abdominal pain, blood in stool, diarrhea, nausea and vomiting.  Genitourinary:  Negative for difficulty urinating, dysuria, flank pain, frequency, hematuria and urgency.  Musculoskeletal:  Negative for arthralgias, back pain, joint swelling, myalgias and neck stiffness.  Skin:  Negative for pallor and rash.  Neurological:  Negative for dizziness, speech difficulty, weakness and headaches.  Hematological:  Negative for adenopathy. Does not bruise/bleed easily.  Psychiatric/Behavioral:  Negative for confusion and sleep disturbance. The patient is not nervous/anxious.     PE;    09/30/2022    3:09 PM 06/30/2022    2:05 PM 05/18/2022    8:22 AM  Vitals with BMI  Height 5\' 11"   6\' 0"   Weight 192 lbs 2 oz 193 lbs 13 oz 200 lbs 6 oz  BMI 26.8  27.17  Systolic 129 129 161  Diastolic 80 87 85  Pulse 68 83 79    Gen: Alert, well appearing.  Patient is oriented to person, place, time, and situation. AFFECT: pleasant, lucid thought and speech. ENT: Ears: EACs clear, normal epithelium.  TMs with good light reflex and landmarks bilaterally.  Eyes: no injection, icteris, swelling, or exudate.  EOMI, PERRLA. Nose: no drainage or turbinate edema/swelling.  No injection or focal lesion.  Mouth: lips without lesion/swelling.  Oral mucosa pink and moist.  Dentition intact and without obvious caries or gingival swelling.  Oropharynx without erythema, exudate, or swelling.  Neck: supple/nontender.  No LAD, mass, or TM.  Carotid pulses 2+ bilaterally, without bruits. CV: RRR, no m/r/g.   LUNGS: CTA bilat, nonlabored resps, good aeration in all lung fields. ABD: soft, NT, ND, BS normal.  No hepatospenomegaly or mass.  No bruits. EXT: no clubbing, cyanosis, or edema.  Musculoskeletal: no joint swelling,  erythema, warmth, or tenderness.  ROM of all joints intact. Skin - no sores or suspicious lesions or rashes or color changes  Pertinent labs:  Lab Results  Component Value Date   TSH 1.36 05/14/2021   Lab Results  Component Value Date   WBC 5.7 09/14/2021   HGB 16.0 09/14/2021   HCT 47 09/14/2021   MCV 90.0 05/14/2021   PLT 249 09/14/2021   Lab Results  Component Value Date   CREATININE 0.86 01/22/2022   BUN 13 01/22/2022   NA 141 01/22/2022   K 4.8 01/22/2022   CL 103 01/22/2022   CO2 35 (H) 01/22/2022   Lab Results  Component Value Date   ALT 27 01/22/2022   AST 17 01/22/2022   ALKPHOS 43 01/22/2022   BILITOT 0.6 01/22/2022   Lab Results  Component Value Date   CHOL 130 01/22/2022   Lab Results  Component Value Date   HDL 44.90 01/22/2022   Lab Results  Component Value Date   LDLCALC 76 01/22/2022   Lab Results  Component Value Date   TRIG 46.0 01/22/2022   Lab Results  Component Value Date   CHOLHDL 3 01/22/2022   Lab Results  Component Value Date   HGBA1C 5.7 05/18/2022   ASSESSMENT AND PLAN:   #1 health maintenance exam: Reviewed age and gender appropriate health maintenance issues (prudent diet, regular exercise, health risks of tobacco and excessive alcohol, use of seatbelts, fire alarms in home, use of sunscreen).  Also reviewed age and gender appropriate health screening as well as vaccine recommendations. Vaccines: all utd Labs: Health panel and hemoglobin A1c--> these labs were drawn this morning already. Prostate ca screening: average risk patient= as per latest guidelines, start screening at 3 yrs of age. Colon ca screening: Adenomatous polyps on colonoscopy 2023.  Recall 5 to 7 years.  #2 weight management, doing excellent on Ozempic 0.5 mg SQ weekly. BMI steady at 26.  #3 anxiety disorder. As needed use of lorazepam  is going well.  He uses this very appropriately and responsibly. Refill #90 with 1 additional refill today.  An  After Visit Summary was printed and given to the patient.  FOLLOW UP:  Return in about 6 months (around 04/02/2023) for routine chronic illness f/u.  Signed:  Santiago Bumpers, MD           09/30/2022

## 2023-01-31 ENCOUNTER — Encounter: Payer: BC Managed Care – PPO | Admitting: Family Medicine

## 2023-04-01 NOTE — Patient Instructions (Signed)

## 2023-04-04 ENCOUNTER — Ambulatory Visit: Payer: BC Managed Care – PPO | Admitting: Family Medicine

## 2023-04-04 ENCOUNTER — Encounter: Payer: Self-pay | Admitting: Family Medicine

## 2023-04-04 VITALS — BP 115/74 | HR 64 | Temp 97.5°F | Ht 71.0 in | Wt 199.0 lb

## 2023-04-04 DIAGNOSIS — R7303 Prediabetes: Secondary | ICD-10-CM

## 2023-04-04 LAB — POCT GLYCOSYLATED HEMOGLOBIN (HGB A1C)
HbA1c POC (<> result, manual entry): 5.6 % (ref 4.0–5.6)
HbA1c, POC (controlled diabetic range): 5.6 % (ref 0.0–7.0)
HbA1c, POC (prediabetic range): 5.6 % — AB (ref 5.7–6.4)
Hemoglobin A1C: 5.6 % (ref 4.0–5.6)

## 2023-04-04 MED ORDER — LORAZEPAM 0.5 MG PO TABS: ORAL_TABLET | ORAL | 1 refills | Status: DC

## 2023-04-04 MED ORDER — OZEMPIC (0.25 OR 0.5 MG/DOSE) 2 MG/3ML ~~LOC~~ SOPN: PEN_INJECTOR | SUBCUTANEOUS | 3 refills | Status: DC

## 2023-04-04 MED ORDER — SEMAGLUTIDE(0.25 OR 0.5MG/DOS) 2 MG/3ML ~~LOC~~ SOPN: 0.2500 mg | PEN_INJECTOR | SUBCUTANEOUS | Status: DC

## 2023-04-04 NOTE — Progress Notes (Signed)
 OFFICE VISIT  04/04/2023  CC:  Chief Complaint  Patient presents with   Medical Management of Chronic Issues    Patient is a 48 y.o. male who presents for 76-month follow-up anxiety, prediabetes, and weight management.  INTERIM HX: Ozempic 0.25 mg weekly was helping well and he had no side effects. About 6 weeks ago he stopped taking it.  Since that time he has noted some intermittent feelings of hypoglycemia, documented some glucoses down into the 60s.  Highest glucoses are in the 120s. His diet is not as good and sometimes he eats a larger meal than normal. He is traveling a lot, hard to keep a good routine. He has gained about 5 pounds since being off the Ozempic.  Lorazepam does consistently help with travel anxiety/fear of airplanes.  PMP AWARE reviewed today: most recent rx for lorazepam 0.5 mg was filled 09/30/2022, # 90, rx by me. No red flags.  Past Medical History:  Diagnosis Date   BPH with obstruction/lower urinary tract symptoms 2018   Pt declined bph med as of 06/2016   Cutaneous abscess of face 04/2017   Disequilibrium syndrome    Elevated blood pressure reading without diagnosis of hypertension    Elevated transaminase level 09/10/2016   [facility] wellness labs--AST 41, ALT 76.   Viral Hep screening NEG.   GAD (generalized anxiety disorder)    Hay fever    Hepatic steatosis    u/s 09/2016   History of adenomatous polyp of colon    04/2022->recall 5-7 yrs   Prediabetes    via work labs 2017/18; A1c 6.2% here 05/2016.  Improved to 5.7% 08/2016.  Then, A1c via [facility] on 09/10/16 was 6.1%.  A1c 6.2% July 2019-->TLC. 6.2% 07/2019. 5.9% 09/2020    Past Surgical History:  Procedure Laterality Date   COLONOSCOPY W/ POLYPECTOMY     2024 Adenoma x 2-recall 5-7 yrs ([facility])   HOLTER MONITOR  09/2016   NSR with occ sinus brady, rare PVC.    Outpatient Medications Prior to Visit  Medication Sig Dispense Refill   aspirin 81 MG chewable tablet Chew 1 tablet (81 mg  total) by mouth daily. 30 tablet 1   Semaglutide,0.25 or 0.5MG /DOS, 2 MG/3ML SOPN Inject 0.5 mg into the skin once a week.     LORazepam (ATIVAN) 0.5 MG tablet TAKE 1 TO 2 TABLETS BY MOUTH TWICE A DAY AS NEEDED FOR ANXIETY/STRESS 90 tablet 1   Semaglutide,0.25 or 0.5MG /DOS, (OZEMPIC, 0.25 OR 0.5 MG/DOSE,) 2 MG/3ML SOPN INJECT 0.5 MG SUBCUTANEOUSLY EVERY WEEK 9 mL 3   No facility-administered medications prior to visit.    No Known Allergies  Review of Systems As per HPI  PE:    04/04/2023    8:25 AM 09/30/2022    3:09 PM 06/30/2022    2:05 PM  Vitals with BMI  Height 5\' 11"  5\' 11"    Weight 199 lbs 192 lbs 2 oz 193 lbs 13 oz  BMI 27.77 26.8   Systolic 115 129 811  Diastolic 74 80 87  Pulse 64 68 83     Physical Exam  Gen: Alert, well appearing.  Patient is oriented to person, place, time, and situation. AFFECT: pleasant, lucid thought and speech. No further exam today  LABS:  Last CBC Lab Results  Component Value Date   WBC 4.9 09/30/2022   HGB 15.8 09/30/2022   HCT 48.1 09/30/2022   MCV 89.8 09/30/2022   MCH 30.2 07/22/2016   RDW 13.7 09/30/2022  PLT 258.0 09/30/2022   Last metabolic panel Lab Results  Component Value Date   GLUCOSE 113 (H) 09/30/2022   NA 140 09/30/2022   K 4.6 09/30/2022   CL 102 09/30/2022   CO2 32 09/30/2022   BUN 14 09/30/2022   CREATININE 0.82 09/30/2022   GFR 106.01 09/30/2022   CALCIUM 9.8 09/30/2022   PROT 7.5 09/30/2022   ALBUMIN 4.8 09/30/2022   BILITOT 0.8 09/30/2022   ALKPHOS 47 09/30/2022   AST 16 09/30/2022   ALT 17 09/30/2022   ANIONGAP 8 07/22/2016   Last lipids Lab Results  Component Value Date   CHOL 157 09/30/2022   HDL 53.90 09/30/2022   LDLCALC 93 09/30/2022   TRIG 54.0 09/30/2022   CHOLHDL 3 09/30/2022   Last hemoglobin A1c Lab Results  Component Value Date   HGBA1C 5.6 04/04/2023   HGBA1C 5.6 04/04/2023   HGBA1C 5.6 (A) 04/04/2023   HGBA1C 5.6 04/04/2023   Last thyroid functions Lab Results   Component Value Date   TSH 0.85 09/30/2022   IMPRESSION AND PLAN:  Prediabetes, weight management. He has done very well on Ozempic 0.25 mg weekly. Has noted some postprandial hypoglycemia as result of some rebalancing of glucagon, insulin, absorption, etc. since being off of Ozempic the last 6 weeks. Will restart Ozempic 0.25 mg weekly.  Point-of-care hemoglobin A1c today is 5.6%.  An After Visit Summary was printed and given to the patient.  FOLLOW UP: Return in about 6 months (around 10/02/2023) for annual CPE (fasting).  Signed:  Santiago Bumpers, MD           04/04/2023

## 2023-04-05 ENCOUNTER — Other Ambulatory Visit (HOSPITAL_COMMUNITY): Payer: Self-pay

## 2023-04-05 ENCOUNTER — Telehealth (HOSPITAL_COMMUNITY): Payer: Self-pay | Admitting: Pharmacy Technician

## 2023-04-05 NOTE — Telephone Encounter (Signed)
 Pharmacy Patient Advocate Encounter   Received notification from  [employer]  that prior authorization for [facility] 0.25MG  OR 0.5MG /DOSE is required/requested.   Insurance verification completed.   The patient is insured through [facility] .   Per test claim: Pt has a diagnosis of prediabetes which does not qualify him for coverage.

## 2023-05-25 ENCOUNTER — Telehealth: Payer: Self-pay

## 2023-05-25 ENCOUNTER — Other Ambulatory Visit: Payer: Self-pay | Admitting: Family Medicine

## 2023-05-25 MED ORDER — OZEMPIC (0.25 OR 0.5 MG/DOSE) 2 MG/3ML ~~LOC~~ SOPN: PEN_INJECTOR | SUBCUTANEOUS | 0 refills | Status: DC

## 2023-05-25 MED ORDER — SEMAGLUTIDE(0.25 OR 0.5MG/DOS) 2 MG/3ML ~~LOC~~ SOPN: 0.5000 mg | PEN_INJECTOR | SUBCUTANEOUS | 0 refills | Status: DC

## 2023-05-25 NOTE — Telephone Encounter (Signed)
 Copied from CRM [id number]. Topic: Clinical - Medication Question >> May 25, 2023  2:31 PM [name] wrote: Reason for CRM: Patient is calling to see if it was possible for him to come by the office to pick up a written prescription for Semaglutide,0.25 or 0.5MG /DOS, 2 MG/3ML SOPN.  Last refill written 04/04/23. Please advise

## 2023-05-25 NOTE — Telephone Encounter (Signed)
 Pt advised the prescriptions will be placed up front for pick up.

## 2023-05-25 NOTE — Telephone Encounter (Signed)
 Ok. Three 0.5mg  rx's printed.

## 2023-07-14 DIAGNOSIS — J209 Acute bronchitis, unspecified: Secondary | ICD-10-CM | POA: Diagnosis not present

## 2023-07-17 ENCOUNTER — Telehealth: Admitting: Family

## 2023-07-17 DIAGNOSIS — J208 Acute bronchitis due to other specified organisms: Secondary | ICD-10-CM

## 2023-07-17 DIAGNOSIS — B9689 Other specified bacterial agents as the cause of diseases classified elsewhere: Secondary | ICD-10-CM | POA: Diagnosis not present

## 2023-07-17 MED ORDER — ALBUTEROL SULFATE HFA 108 (90 BASE) MCG/ACT IN AERS: 2.0000 | INHALATION_SPRAY | Freq: Four times a day (QID) | RESPIRATORY_TRACT | 0 refills | Status: DC | PRN

## 2023-07-17 MED ORDER — PREDNISONE 10 MG (21) PO TBPK
ORAL_TABLET | ORAL | 0 refills | Status: DC
Start: 2023-07-17 — End: 2023-09-14

## 2023-07-17 NOTE — Progress Notes (Signed)
 Virtual Visit Consent   Joshua Page, you are scheduled for a virtual visit with a [facility] provider today. Just as with appointments in the office, your consent must be obtained to participate. Your consent will be active for this visit and any virtual visit you may have with one of our providers in the next 365 days. If you have a MyChart account, a copy of this consent can be sent to you electronically.  As this is a virtual visit, video technology does not allow for your provider to perform a traditional examination. This may limit your provider's ability to fully assess your condition. If your provider identifies any concerns that need to be evaluated in person or the need to arrange testing (such as labs, EKG, etc.), we will make arrangements to do so. Although advances in technology are sophisticated, we cannot ensure that it will always work on either your end or our end. If the connection with a video visit is poor, the visit may have to be switched to a telephone visit. With either a video or telephone visit, we are not always able to ensure that we have a secure connection.  By engaging in this virtual visit, you consent to the provision of healthcare and authorize for your insurance to be billed (if applicable) for the services provided during this visit. Depending on your insurance coverage, you may receive a charge related to this service.  I need to obtain your verbal consent now. Are you willing to proceed with your visit today? Bonifacio Pruden has provided verbal consent on 07/17/2023 for a virtual visit (video or telephone). Tommas Fragmin, FNP  Date: 07/17/2023 1:02 PM   Virtual Visit via Video Note   I, Tommas Fragmin, connected with  Joshua Page  ([id number], 09-May-1975) on 07/17/23 at  1:00 PM EDT by a video-enabled telemedicine application and verified that I am speaking with the correct person using two identifiers.  Location: Patient: Virtual Visit Location Patient: Home Provider: Virtual  Visit Location Provider: Home Office   I discussed the limitations of evaluation and management by telemedicine and the availability of in person appointments. The patient expressed understanding and agreed to proceed.    History of Present Illness: Joshua Page is a 48 y.o. who identifies as a male who was assigned male at birth, and is being seen today for shortness of breath, chest heaviness, fever, and cough that started 5 days. He was seen at the Urgent Care on 3 days ago and started on doxycyline 100 mg BID for 10 days.   HPI: Cough This is a new problem. The current episode started in the past 7 days. The problem has been gradually worsening. The problem occurs every few minutes. The cough is Non-productive. Associated symptoms include chills, ear congestion, a fever, headaches, myalgias, nasal congestion, postnasal drip, a sore throat, shortness of breath and wheezing. Pertinent negatives include no ear pain. He has tried rest for the symptoms. The treatment provided mild relief.    Problems:  Patient Active Problem List   Diagnosis Date Noted   GAD (generalized anxiety disorder) 05/19/2017   Prediabetes     Allergies: No Known Allergies Medications:  Current Outpatient Medications:    albuterol (VENTOLIN HFA) 108 (90 Base) MCG/ACT inhaler, Inhale 2 puffs into the lungs every 6 (six) hours as needed for wheezing or shortness of breath., Disp: 8 g, Rfl: 0   predniSONE (STERAPRED UNI-PAK 21 TAB) 10 MG (21) TBPK tablet, Use as directed, Disp: 21 tablet, Rfl:  0   aspirin  81 MG chewable tablet, Chew 1 tablet (81 mg total) by mouth daily., Disp: 30 tablet, Rfl: 1   LORazepam  (ATIVAN ) 0.5 MG tablet, TAKE 1 TO 2 TABLETS BY MOUTH TWICE A DAY AS NEEDED FOR ANXIETY/STRESS, Disp: 90 tablet, Rfl: 1   Semaglutide ,0.25 or 0.5MG /DOS, (OZEMPIC , 0.25 OR 0.5 MG/DOSE,) 2 MG/3ML SOPN, INJECT 0.5 MG SUBCUTANEOUSLY EVERY WEEK, Disp: 3 mL, Rfl: 0   Semaglutide ,0.25 or 0.5MG /DOS, 2 MG/3ML SOPN, Inject 0.5 mg into  the skin once a week., Disp: 3 mL, Rfl: 0   Semaglutide ,0.25 or 0.5MG /DOS, 2 MG/3ML SOPN, Inject 0.5 mg as directed once a week., Disp: 3 mL, Rfl: 0  Observations/Objective: Patient is well-developed, well-nourished in no acute distress.  Resting comfortably  at home.  Head is normocephalic, atraumatic.  No labored breathing.  Speech is clear and coherent with logical content.  Patient is alert and oriented at baseline.  Coarse nonproductive, wheezing   Assessment and Plan: 1. Acute bacterial bronchitis (Primary) - predniSONE (STERAPRED UNI-PAK 21 TAB) 10 MG (21) TBPK tablet; Use as directed  Dispense: 21 tablet; Refill: 0 - albuterol (VENTOLIN HFA) 108 (90 Base) MCG/ACT inhaler; Inhale 2 puffs into the lungs every 6 (six) hours as needed for wheezing or shortness of breath.  Dispense: 8 g; Refill: 0   Continue doxycyline  Start prednisone and albuterol as needed - Take meds as prescribed - Use a cool mist humidifier  -Use saline nose sprays frequently -Force fluids -For any cough or congestion  Use plain Mucinex- regular strength or max strength is fine -For fever or aces or pains- take tylenol or ibuprofen. -Throat lozenges if help Follow up if symptoms worsen or do not improve    Follow Up Instructions: I discussed the assessment and treatment plan with the patient. The patient was provided an opportunity to ask questions and all were answered. The patient agreed with the plan and demonstrated an understanding of the instructions.  A copy of instructions were sent to the patient via MyChart unless otherwise noted below.     The patient was advised to call back or seek an in-person evaluation if the symptoms worsen or if the condition fails to improve as anticipated.    Tommas Fragmin, FNP

## 2023-07-22 ENCOUNTER — Other Ambulatory Visit: Payer: Self-pay | Admitting: Family

## 2023-07-22 ENCOUNTER — Other Ambulatory Visit: Payer: Self-pay | Admitting: Family Medicine

## 2023-07-22 DIAGNOSIS — B9689 Other specified bacterial agents as the cause of diseases classified elsewhere: Secondary | ICD-10-CM

## 2023-08-05 ENCOUNTER — Encounter: Payer: Self-pay | Admitting: Family Medicine

## 2023-08-08 ENCOUNTER — Ambulatory Visit: Admitting: Family Medicine

## 2023-08-08 ENCOUNTER — Encounter: Payer: Self-pay | Admitting: Family Medicine

## 2023-08-08 VITALS — BP 135/89 | HR 73 | Temp 97.9°F | Wt 207.2 lb

## 2023-08-08 DIAGNOSIS — Z7689 Persons encountering health services in other specified circumstances: Secondary | ICD-10-CM

## 2023-08-08 DIAGNOSIS — F988 Other specified behavioral and emotional disorders with onset usually occurring in childhood and adolescence: Secondary | ICD-10-CM

## 2023-08-08 DIAGNOSIS — F411 Generalized anxiety disorder: Secondary | ICD-10-CM

## 2023-08-08 MED ORDER — SEMAGLUTIDE-WEIGHT MANAGEMENT 0.5 MG/0.5ML ~~LOC~~ SOAJ: 0.5000 mg | SUBCUTANEOUS | 11 refills | Status: DC

## 2023-08-08 NOTE — Progress Notes (Signed)
 OFFICE VISIT  08/08/2023  CC:  Chief Complaint  Patient presents with   Psych referral    Requesting psych referral for adhd eval    Patient is a 48 y.o. male who presents for follow-up anxiety and weight management.  HPI: Still having problems with chronic anxiety.  Feels overwhelmed easily, particularly driving in lots of traffic and in busy restaurants.  He gets a feeling of frustration, feeling nervous/anxious, sometimes almost to the point of panic. He has been seeing a counselor via the web/APP but requests a referral to see someone in person now. He denies depressed mood.  He has been on 0.5 mg semaglutide  through a compounding pharmacy.  Has been paying out-of-pocket. Feels like it has not worked nearly as well as when he was on prescription Ozempic  0.25 and 0.5 mg dosing.  Past Medical History:  Diagnosis Date   BPH with obstruction/lower urinary tract symptoms 2018   Pt declined bph med as of 06/2016   Cutaneous abscess of face 04/2017   Disequilibrium syndrome    Elevated blood pressure reading without diagnosis of hypertension    Elevated transaminase level 09/10/2016   [facility] wellness labs--AST 41, ALT 76.   Viral Hep screening NEG.   GAD (generalized anxiety disorder)    Hay fever    Hepatic steatosis    u/s 09/2016   History of adenomatous polyp of colon    04/2022->recall 5-7 yrs   Prediabetes    via work labs 2017/18; A1c 6.2% here 05/2016.  Improved to 5.7% 08/2016.  Then, A1c via [facility] on 09/10/16 was 6.1%.  A1c 6.2% July 2019-->TLC. 6.2% 07/2019. 5.9% 09/2020    Past Surgical History:  Procedure Laterality Date   COLONOSCOPY W/ POLYPECTOMY     2024 Adenoma x 2-recall 5-7 yrs ([facility])   HOLTER MONITOR  09/2016   NSR with occ sinus brady, rare PVC.    Outpatient Medications Prior to Visit  Medication Sig Dispense Refill   albuterol  (VENTOLIN  HFA) 108 (90 Base) MCG/ACT inhaler TAKE 2 PUFFS BY MOUTH EVERY 6 HOURS AS NEEDED FOR WHEEZE OR SHORTNESS OF  BREATH 18 each 0   aspirin  81 MG chewable tablet Chew 1 tablet (81 mg total) by mouth daily. 30 tablet 1   LORazepam  (ATIVAN ) 0.5 MG tablet TAKE 1 TO 2 TABLETS BY MOUTH TWICE A DAY AS NEEDED FOR ANXIETY/STRESS 90 tablet 1   predniSONE  (STERAPRED UNI-PAK 21 TAB) 10 MG (21) TBPK tablet Use as directed 21 tablet 0   Semaglutide ,0.25 or 0.5MG /DOS, (OZEMPIC , 0.25 OR 0.5 MG/DOSE,) 2 MG/3ML SOPN INJECT 0.5 MG SUBCUTANEOUSLY EVERY WEEK 3 mL 0   Semaglutide ,0.25 or 0.5MG /DOS, 2 MG/3ML SOPN Inject 0.5 mg into the skin once a week. 3 mL 0   Semaglutide ,0.25 or 0.5MG /DOS, 2 MG/3ML SOPN Inject 0.5 mg as directed once a week. 3 mL 0   No facility-administered medications prior to visit.    No Known Allergies  Review of Systems  As per HPI  PE:    08/08/2023    3:57 PM 04/04/2023    8:25 AM 09/30/2022    3:09 PM  Vitals with BMI  Height  5' 11 5' 11  Weight 207 lbs 3 oz 199 lbs 192 lbs 2 oz  BMI  27.77 26.8  Systolic 135 115 870  Diastolic 89 74 80  Pulse 73 64 68    Physical Exam  General: Alert and well-appearing. Affect: Thought and speech are lucid.  Pleasant. No further exam  today  LABS:  Last CBC Lab Results  Component Value Date   WBC 4.9 09/30/2022   HGB 15.8 09/30/2022   HCT 48.1 09/30/2022   MCV 89.8 09/30/2022   MCH 30.2 07/22/2016   RDW 13.7 09/30/2022   PLT 258.0 09/30/2022   Last metabolic panel Lab Results  Component Value Date   GLUCOSE 113 (H) 09/30/2022   NA 140 09/30/2022   K 4.6 09/30/2022   CL 102 09/30/2022   CO2 32 09/30/2022   BUN 14 09/30/2022   CREATININE 0.82 09/30/2022   GFR 106.01 09/30/2022   CALCIUM 9.8 09/30/2022   PROT 7.5 09/30/2022   ALBUMIN 4.8 09/30/2022   BILITOT 0.8 09/30/2022   ALKPHOS 47 09/30/2022   AST 16 09/30/2022   ALT 17 09/30/2022   ANIONGAP 8 07/22/2016   Last lipids Lab Results  Component Value Date   CHOL 157 09/30/2022   HDL 53.90 09/30/2022   LDLCALC 93 09/30/2022   TRIG 54.0 09/30/2022   CHOLHDL 3  09/30/2022   Last hemoglobin A1c Lab Results  Component Value Date   HGBA1C 5.6 04/04/2023   HGBA1C 5.6 04/04/2023   HGBA1C 5.6 (A) 04/04/2023   HGBA1C 5.6 04/04/2023   Last thyroid  functions Lab Results  Component Value Date   TSH 0.85 09/30/2022   IMPRESSION AND PLAN:  #1 episodic anxiety superimposed on generalized anxiety. Question of whether undiagnosed/untreated ADD is driving this. Refer to [place] [facility] for further evaluation. Also will refer for counseling.  2.  Overweight/weight management. He prefers to stop his compounded semaglutide  and will start prescription/brand Wegovy  0.5 mg weekly.  Prescription x 1 month today, refill x 11.  An After Visit Summary was printed and given to the patient.  FOLLOW UP: Return for Keep appointment 09/19/2023 for CPE.  Signed:  Gerlene Hockey, MD           08/08/2023

## 2023-08-09 ENCOUNTER — Telehealth: Payer: Self-pay

## 2023-08-09 ENCOUNTER — Other Ambulatory Visit (HOSPITAL_COMMUNITY): Payer: Self-pay

## 2023-08-09 NOTE — Telephone Encounter (Signed)
 Pharmacy Patient Advocate Encounter   Received notification from CoverMyMeds that prior authorization for Wegovy  is required/requested.   Insurance verification completed.   The patient is insured through [employer] .   Per test claim: PA required; PA submitted to above mentioned insurance via CoverMyMeds Key/confirmation #/EOC [facility] Status is pending

## 2023-08-11 ENCOUNTER — Other Ambulatory Visit (HOSPITAL_COMMUNITY): Payer: Self-pay

## 2023-08-15 NOTE — Telephone Encounter (Signed)
 Pharmacy Patient Advocate Encounter  Received notification from [facility] that Prior Authorization for WEGOVY  has been DENIED.  Full denial letter will be uploaded to the media tab. See denial reason below.   PA #/Case ID/Reference #: [id number]

## 2023-08-15 NOTE — Telephone Encounter (Signed)
 Pt notified via My Chart

## 2023-08-16 NOTE — Telephone Encounter (Signed)
 No further action needed.

## 2023-08-18 ENCOUNTER — Ambulatory Visit: Admitting: Family Medicine

## 2023-09-05 ENCOUNTER — Telehealth: Payer: Self-pay

## 2023-09-05 ENCOUNTER — Other Ambulatory Visit (HOSPITAL_COMMUNITY): Payer: Self-pay

## 2023-09-05 NOTE — Telephone Encounter (Signed)
Pt aware.

## 2023-09-05 NOTE — Telephone Encounter (Signed)
 Pt weight 04/04/23  199 lbs. Pt weight 08/08/23 207 lbs. See encounter 08/15/23 for Wegovy  denial.  Pharmacy Patient Advocate Encounter   Received notification from CoverMyMeds that prior authorization for Wegovy  0.5 is required/requested.   Insurance verification completed.   The patient is insured through [employer] .   Per test claim: this medication was denied 08/15/23 by insurance
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
## 2023-09-07 ENCOUNTER — Ambulatory Visit (HOSPITAL_COMMUNITY): Payer: Self-pay | Admitting: Psychiatry

## 2023-09-07 ENCOUNTER — Encounter (HOSPITAL_COMMUNITY): Payer: Self-pay | Admitting: Psychiatry

## 2023-09-07 VITALS — BP 127/83 | HR 78 | Ht 71.0 in | Wt 208.0 lb

## 2023-09-07 DIAGNOSIS — F411 Generalized anxiety disorder: Secondary | ICD-10-CM

## 2023-09-07 DIAGNOSIS — F410 Panic disorder [episodic paroxysmal anxiety] without agoraphobia: Secondary | ICD-10-CM | POA: Diagnosis not present

## 2023-09-07 MED ORDER — HYDROXYZINE HCL 10 MG PO TABS: 10.0000 mg | ORAL_TABLET | Freq: Every day | ORAL | 1 refills | Status: DC | PRN

## 2023-09-07 MED ORDER — SERTRALINE HCL 25 MG PO TABS: 25.0000 mg | ORAL_TABLET | Freq: Every day | ORAL | 1 refills | Status: DC

## 2023-09-07 NOTE — Patient Instructions (Addendum)
 [facility]:  [phone]  Please take zoloft  25mg  for anxiety and take hydroxyzine  10mg  daily PRN for panic attacks

## 2023-09-17 ENCOUNTER — Ambulatory Visit (HOSPITAL_COMMUNITY): Admitting: Psychiatry

## 2023-09-17 ENCOUNTER — Encounter (HOSPITAL_COMMUNITY): Payer: Self-pay

## 2023-09-19 ENCOUNTER — Ambulatory Visit (INDEPENDENT_AMBULATORY_CARE_PROVIDER_SITE_OTHER): Payer: BC Managed Care – PPO | Admitting: Family Medicine

## 2023-09-19 ENCOUNTER — Telehealth: Payer: Self-pay

## 2023-09-19 VITALS — BP 129/82 | HR 67 | Temp 97.9°F | Ht 71.5 in | Wt 207.2 lb

## 2023-09-19 DIAGNOSIS — Z125 Encounter for screening for malignant neoplasm of prostate: Secondary | ICD-10-CM | POA: Diagnosis not present

## 2023-09-19 DIAGNOSIS — F410 Panic disorder [episodic paroxysmal anxiety] without agoraphobia: Secondary | ICD-10-CM | POA: Diagnosis not present

## 2023-09-19 DIAGNOSIS — Z0000 Encounter for general adult medical examination without abnormal findings: Secondary | ICD-10-CM

## 2023-09-19 DIAGNOSIS — Z7689 Persons encountering health services in other specified circumstances: Secondary | ICD-10-CM

## 2023-09-19 DIAGNOSIS — Z79899 Other long term (current) drug therapy: Secondary | ICD-10-CM

## 2023-09-19 DIAGNOSIS — F411 Generalized anxiety disorder: Secondary | ICD-10-CM | POA: Diagnosis not present

## 2023-09-19 LAB — COMPREHENSIVE METABOLIC PANEL WITH GFR
ALT: 44 U/L (ref 0–53)
AST: 25 U/L (ref 0–37)
Albumin: 4.5 g/dL (ref 3.5–5.2)
Alkaline Phosphatase: 43 U/L (ref 39–117)
BUN: 9 mg/dL (ref 6–23)
CO2: 30 meq/L (ref 19–32)
Calcium: 9.1 mg/dL (ref 8.4–10.5)
Chloride: 104 meq/L (ref 96–112)
Creatinine, Ser: 0.78 mg/dL (ref 0.40–1.50)
GFR: 106.89 mL/min (ref >60.00)
Glucose, Bld: 96 mg/dL (ref 70–99)
Potassium: 3.9 meq/L (ref 3.5–5.1)
Sodium: 141 meq/L (ref 135–145)
Total Bilirubin: 0.7 mg/dL (ref 0.2–1.2)
Total Protein: 6.9 g/dL (ref 6.0–8.3)

## 2023-09-19 LAB — CBC WITH DIFFERENTIAL/PLATELET
Basophils Absolute: 0.1 K/uL (ref 0.0–0.1)
Basophils Relative: 1.3 % (ref 0.0–3.0)
Eosinophils Absolute: 0.1 K/uL (ref 0.0–0.7)
Eosinophils Relative: 3.1 % (ref 0.0–5.0)
HCT: 45.8 % (ref 39.0–52.0)
Hemoglobin: 15.4 g/dL (ref 13.0–17.0)
Lymphocytes Relative: 34.9 % (ref 12.0–46.0)
Lymphs Abs: 1.6 K/uL (ref 0.7–4.0)
MCHC: 33.7 g/dL (ref 30.0–36.0)
MCV: 89.1 fl (ref 78.0–100.0)
Monocytes Absolute: 0.3 K/uL (ref 0.1–1.0)
Monocytes Relative: 7.6 % (ref 3.0–12.0)
Neutro Abs: 2.4 K/uL (ref 1.4–7.7)
Neutrophils Relative %: 53.1 % (ref 43.0–77.0)
Platelets: 237 K/uL (ref 150.0–400.0)
RBC: 5.14 Mil/uL (ref 4.22–5.81)
RDW: 13.6 % (ref 11.5–15.5)
WBC: 4.6 K/uL (ref 4.0–10.5)

## 2023-09-19 LAB — LIPID PANEL
Cholesterol: 148 mg/dL (ref 0–200)
HDL: 45.9 mg/dL (ref >39.00)
LDL Cholesterol: 90 mg/dL (ref 0–99)
NonHDL: 102.44
Total CHOL/HDL Ratio: 3
Triglycerides: 62 mg/dL (ref 0.0–149.0)
VLDL: 12.4 mg/dL (ref 0.0–40.0)

## 2023-09-19 LAB — PSA: PSA: 0.48 ng/mL (ref 0.10–4.00)

## 2023-09-19 LAB — HEMOGLOBIN A1C: Hgb A1c MFr Bld: 5.9 % (ref 4.6–6.5)

## 2023-09-19 LAB — TSH: TSH: 1.55 u[IU]/mL (ref 0.35–5.50)

## 2023-09-19 MED ORDER — LORAZEPAM 0.5 MG PO TABS: ORAL_TABLET | ORAL | 1 refills | Status: DC

## 2023-09-19 MED ORDER — WEGOVY 1 MG/0.5ML ~~LOC~~ SOAJ: 1.0000 mg | SUBCUTANEOUS | 2 refills | Status: DC

## 2023-09-19 NOTE — Telephone Encounter (Signed)
 Pharmacy Patient Advocate Encounter   Received notification from CoverMyMeds that prior authorization for Wegovy  1MG /0.5ML auto-injectors is required/requested.   Insurance verification completed.   The patient is insured through [employer] .   Per test claim: PA required; PA submitted to above mentioned insurance via CoverMyMeds Key/confirmation #/EOC [id number] Status is pending

## 2023-09-19 NOTE — Progress Notes (Signed)
 Office Note 09/19/2023  CC:  Chief Complaint  Patient presents with   Annual Exam    Pt is fasting    HPI:  Patient is a 48 y.o. male who is here for annual health maintenance exam and 6-week follow-up weight management and anxiety. A/P as of last visit: #1 episodic anxiety superimposed on generalized anxiety. Question of whether undiagnosed/untreated ADD is driving this. Refer to [facility] for further evaluation. Also will refer for counseling.   2.  Overweight/weight management. He prefers to stop his compounded semaglutide  and will start prescription/brand Wegovy  0.5 mg weekly.  Prescription x 1 month today, refill x 11.  INTERIM HX: Feeling well. He saw psychiatrist, on 09/07/2023 and was rx'd sertraline  and hydroxyzine . Waiting to start the sertraline  b/c lots of big travel coming up and he recalls having a tough 2 to 3 weeks when he was put on citalopram  in the past. He tried hydroxyzine  once and it made him way too tired.  He continues to take his lorazepam  appropriately for periods of acute anxiety, mostly airplane flights.  He does not note any change in appetite or eating behavior on 0.5 mg Wegovy .  PMP AWARE reviewed today: most recent rx for lorazepam  was filled 07/22/2023, # 90, rx by me. No red flags.  Past Medical History:  Diagnosis Date   BPH with obstruction/lower urinary tract symptoms 2018   Pt declined bph med as of 06/2016   Cutaneous abscess of face 04/2017   Disequilibrium syndrome    Elevated blood pressure reading without diagnosis of hypertension    Elevated transaminase level 09/10/2016   [facility] wellness labs--AST 41, ALT 76.   Viral Hep screening NEG.   GAD (generalized anxiety disorder)    Hay fever    Hepatic steatosis    u/s 09/2016   History of adenomatous polyp of colon    04/2022->recall 5-7 yrs   Prediabetes    via work labs 2017/18; A1c 6.2% here 05/2016.  Improved to 5.7% 08/2016.  Then, A1c via [facility] on 09/10/16  was 6.1%.  A1c 6.2% July 2019-->[facility]. 6.2% 07/2019. 5.9% 09/2020    Past Surgical History:  Procedure Laterality Date   COLONOSCOPY W/ POLYPECTOMY     2024 Adenoma x 2-recall 5-7 yrs ([facility])   HOLTER MONITOR  09/2016   NSR with occ sinus brady, rare PVC.    Family History  Problem Relation Age of Onset   Diabetes Father    Alcohol abuse Father    Mental illness Father        Bipolar + schizophrenia   Diabetes Maternal Grandmother    Pancreatic cancer Maternal Grandfather     Social History   Socioeconomic History   Marital status: Married    Spouse name: Not on file   Number of children: Not on file   Years of education: Not on file   Highest education level: Bachelor's degree (e.g., BA, AB, BS)  Occupational History   Not on file  Tobacco Use   Smoking status: Former    Current packs/day: 0.00    Average packs/day: 0.3 packs/day for 15.0 years (3.8 ttl pk-yrs)    Types: Cigarettes    Start date: 02/15/1998    Quit date: 02/15/2013    Years since quitting: 10.5   Smokeless tobacco: Former    Types: Chew    Quit date: 02/15/2014  Substance and Sexual Activity   Alcohol use: No   Drug use: No   Sexual activity:  Yes  Other Topics Concern   Not on file  Social History Narrative   Married, 5 children.     Educ: college Surveyor, quantity)   [employer]	[place] in past.   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   No tobacco as of 2016.  5 pack-yr hx prior.  Hx of dipping snuff as well.   Social Drivers of Corporate investment banker Strain: Low Risk  (09/19/2023)   Overall Financial Resource Strain (CARDIA)    Difficulty of Paying Living Expenses: Not hard at all  Food Insecurity: No Food Insecurity (09/19/2023)   Hunger Vital Sign    Worried About Running Out of Food in the Last Year: Never true    Ran Out of Food in the Last Year: Never true  Transportation Needs: No Transportation Needs (09/19/2023)   PRAPARE - Administrator, Civil Service  (Medical): No    Lack of Transportation (Non-Medical): No  Physical Activity: Sufficiently Active (09/19/2023)   Exercise Vital Sign    Days of Exercise per Week: 7 days    Minutes of Exercise per Session: 30 min  Stress: Stress Concern Present (09/19/2023)   [employer] of Occupational Health - Occupational Stress Questionnaire    Feeling of Stress: Rather much  Social Connections: Socially Integrated (09/19/2023)   Social Connection and Isolation Panel    Frequency of Communication with Friends and Family: Twice a week    Frequency of Social Gatherings with Friends and Family: Once a week    Attends Religious Services: More than 4 times per year    Active Member of [employer] or Organizations: Yes    Attends Banker Meetings: More than 4 times per year    Marital Status: Married  Catering manager Violence: Unknown (02/02/2022)   Received from [facility]   HITS    Physically Hurt: Not on file    Insult or Talk Down To: Not on file    Threaten Physical Harm: Not on file    Scream or Curse: Not on file    Outpatient Medications Prior to Visit  Medication Sig Dispense Refill   albuterol  (VENTOLIN  HFA) 108 (90 Base) MCG/ACT inhaler TAKE 2 PUFFS BY MOUTH EVERY 6 HOURS AS NEEDED FOR WHEEZE OR SHORTNESS OF BREATH 18 each 0   aspirin  81 MG chewable tablet Chew 1 tablet (81 mg total) by mouth daily. 30 tablet 1   hydrOXYzine  (ATARAX ) 10 MG tablet Take 1 tablet (10 mg total) by mouth daily as needed for anxiety. 30 tablet 1   sertraline  (ZOLOFT ) 25 MG tablet Take 1 tablet (25 mg total) by mouth at bedtime. 30 tablet 1   LORazepam  (ATIVAN ) 0.5 MG tablet TAKE 1 TO 2 TABLETS BY MOUTH TWICE A DAY AS NEEDED FOR ANXIETY/STRESS 90 tablet 1   Semaglutide -Weight Management 0.5 MG/0.5ML SOAJ Inject 0.5 mg into the skin once a week. 2 mL 11   predniSONE  (STERAPRED UNI-PAK 21 TAB) 10 MG (21) TBPK tablet Use as directed 21 tablet 0   No facility-administered medications prior to visit.     No Known Allergies  Review of Systems  Constitutional:  Negative for appetite change, chills, fatigue and fever.  HENT:  Negative for congestion, dental problem, ear pain and sore throat.   Eyes:  Negative for discharge, redness and visual disturbance.  Respiratory:  Negative for cough, chest tightness, shortness of breath and wheezing.   Cardiovascular:  Negative for chest pain, palpitations and leg swelling.  Gastrointestinal:  Negative for abdominal pain, blood in stool, diarrhea, nausea and vomiting.  Genitourinary:  Negative for difficulty urinating, dysuria, flank pain, frequency, hematuria and urgency.  Musculoskeletal:  Negative for arthralgias, back pain, joint swelling, myalgias and neck stiffness.  Skin:  Negative for pallor and rash.  Neurological:  Negative for dizziness, speech difficulty, weakness and headaches.  Hematological:  Negative for adenopathy. Does not bruise/bleed easily.  Psychiatric/Behavioral:  Negative for confusion and sleep disturbance. The patient is not nervous/anxious.     PE;    09/19/2023    8:55 AM 09/07/2023    9:41 AM 08/08/2023    3:57 PM  Vitals with BMI  Height 5' 11.5 5' 11   Weight 207 lbs 3 oz 208 lbs 207 lbs 3 oz  BMI 28.5 29.02   Systolic 129 127 864  Diastolic 82 83 89  Pulse 67 78 73   Gen: Alert, well appearing.  Patient is oriented to person, place, time, and situation. AFFECT: pleasant, lucid thought and speech. ENT: Ears: EACs clear, normal epithelium.  TMs with good light reflex and landmarks bilaterally.  Eyes: no injection, icteris, swelling, or exudate.  EOMI, PERRLA. Nose: no drainage or turbinate edema/swelling.  No injection or focal lesion.  Mouth: lips without lesion/swelling.  Oral mucosa pink and moist.  Dentition intact and without obvious caries or gingival swelling.  Oropharynx without erythema, exudate, or swelling.  Neck: supple/nontender.  No LAD, mass, or TM.  Carotid pulses 2+ bilaterally, without  bruits. CV: RRR, no m/r/g.   LUNGS: CTA bilat, nonlabored resps, good aeration in all lung fields. ABD: soft, NT, ND, BS normal.  No hepatospenomegaly or mass.  No bruits. EXT: no clubbing, cyanosis, or edema.  Musculoskeletal: no joint swelling, erythema, warmth, or tenderness.  ROM of all joints intact. Skin - no sores or suspicious lesions or rashes or color changes  Pertinent labs:  Lab Results  Component Value Date   TSH 0.85 09/30/2022   Lab Results  Component Value Date   WBC 4.9 09/30/2022   HGB 15.8 09/30/2022   HCT 48.1 09/30/2022   MCV 89.8 09/30/2022   PLT 258.0 09/30/2022   Lab Results  Component Value Date   CREATININE 0.82 09/30/2022   BUN 14 09/30/2022   NA 140 09/30/2022   K 4.6 09/30/2022   CL 102 09/30/2022   CO2 32 09/30/2022   Lab Results  Component Value Date   ALT 17 09/30/2022   AST 16 09/30/2022   ALKPHOS 47 09/30/2022   BILITOT 0.8 09/30/2022   Lab Results  Component Value Date   CHOL 157 09/30/2022   Lab Results  Component Value Date   HDL 53.90 09/30/2022   Lab Results  Component Value Date   LDLCALC 93 09/30/2022   Lab Results  Component Value Date   TRIG 54.0 09/30/2022   Lab Results  Component Value Date   CHOLHDL 3 09/30/2022   Lab Results  Component Value Date   HGBA1C 5.6 04/04/2023   HGBA1C 5.6 04/04/2023   HGBA1C 5.6 (A) 04/04/2023   HGBA1C 5.6 04/04/2023   ASSESSMENT AND PLAN:   #1 health maintenance exam: Reviewed age and gender appropriate health maintenance issues (prudent diet, regular exercise, health risks of tobacco and excessive alcohol, use of seatbelts, fire alarms in home, use of sunscreen).  Also reviewed age and gender appropriate health screening as well as vaccine recommendations. Vaccines: all utd Labs: Fasting health panel and hemoglobin A1c and PSA. Prostate ca screening: PSA  today Colon ca screening: Adenomatous polyps on colonoscopy 2023.  Recall 5 to 7 years.  #2 weight  management. Increase Wegovy  to 1 mg every week. Follow-up 3 months.  #3 generalized anxiety disorder. History of panic attacks as well. Continue lorazepam  0.5 mg, 1 to 2 tablets twice daily as needed, #90, refill x 1. Urine drug screen today. He is established with psychiatry.  He will start sertraline  50 mg daily when he is done with his upcoming travel.  An After Visit Summary was printed and given to the patient.  FOLLOW UP:  Return in about 3 months (around 12/20/2023) for routine chronic illness f/u.  Signed:  Gerlene Hockey, MD           09/19/2023

## 2023-09-20 ENCOUNTER — Ambulatory Visit: Payer: Self-pay | Admitting: Family Medicine

## 2023-09-20 DIAGNOSIS — D3613 Benign neoplasm of peripheral nerves and autonomic nervous system of lower limb, including hip: Secondary | ICD-10-CM | POA: Diagnosis not present

## 2023-09-20 DIAGNOSIS — L738 Other specified follicular disorders: Secondary | ICD-10-CM | POA: Diagnosis not present

## 2023-09-20 DIAGNOSIS — Z129 Encounter for screening for malignant neoplasm, site unspecified: Secondary | ICD-10-CM | POA: Diagnosis not present

## 2023-09-20 DIAGNOSIS — D485 Neoplasm of uncertain behavior of skin: Secondary | ICD-10-CM | POA: Diagnosis not present

## 2023-09-20 DIAGNOSIS — L821 Other seborrheic keratosis: Secondary | ICD-10-CM | POA: Diagnosis not present

## 2023-09-21 LAB — DM TEMPLATE

## 2023-09-21 LAB — DRUG MONITORING PANEL 375977 , URINE

## 2023-09-23 NOTE — Telephone Encounter (Signed)
Patient pays out of pocket for medication.

## 2023-09-23 NOTE — Telephone Encounter (Signed)
 Pharmacy Patient Advocate Encounter  Received notification from [facility] that Prior Authorization for Wegovy  1MG /0.5ML auto-injectors has been DENIED.  Full denial letter will be uploaded to the media tab. See denial reason below.   PA #/Case ID/Reference #: [id number]

## 2023-09-28 ENCOUNTER — Other Ambulatory Visit (HOSPITAL_COMMUNITY): Payer: Self-pay

## 2023-10-03 ENCOUNTER — Other Ambulatory Visit (HOSPITAL_COMMUNITY): Payer: Self-pay | Admitting: Psychiatry

## 2023-10-03 ENCOUNTER — Telehealth (HOSPITAL_COMMUNITY): Payer: Self-pay | Admitting: *Deleted

## 2023-10-03 ENCOUNTER — Encounter (HOSPITAL_COMMUNITY): Payer: Self-pay | Admitting: *Deleted

## 2023-10-03 DIAGNOSIS — F410 Panic disorder [episodic paroxysmal anxiety] without agoraphobia: Secondary | ICD-10-CM

## 2023-10-03 MED ORDER — HYDROXYZINE HCL 10 MG PO TABS: 10.0000 mg | ORAL_TABLET | Freq: Every day | ORAL | 0 refills | Status: DC | PRN

## 2023-10-03 MED ORDER — SERTRALINE HCL 25 MG PO TABS: 25.0000 mg | ORAL_TABLET | Freq: Every day | ORAL | 0 refills | Status: DC

## 2023-10-03 NOTE — Progress Notes (Signed)
 Received request from nurse line  Pt insurance ([employer]/[facility]) is requesting 90 day prescriptions for the Zoloft  25 mg and the Hydorxyzine 10 mg. Last visit: 09/07/23 Next visit: 10/19/23 Please review. Thanks.  I called [facility]'s [facility] ([facility]) and spoke who pharmacist who reported that patient filled rx on 09/07/23 and next refill is due 10/08/23. I changed patient's prescription so now patient has 90 day supply of zoloft  and atarax  that will start on 10/08/23. Patient's next visit is 10/19/23.

## 2023-10-03 NOTE — Telephone Encounter (Signed)
 Pt insurance ([employer]) is requesting 90 day prescriptions for the Zoloft  25 mg and the Hydorxyzine 10 mg.  Last visit: 09/07/23 Next visit:10/19/23  Please review. Thanks.

## 2023-10-03 NOTE — Telephone Encounter (Signed)
 Correct
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
## 2023-10-19 ENCOUNTER — Encounter (HOSPITAL_COMMUNITY): Payer: Self-pay | Admitting: Psychiatry

## 2023-10-19 ENCOUNTER — Ambulatory Visit (HOSPITAL_COMMUNITY): Admitting: Psychiatry

## 2023-10-19 VITALS — BP 133/92 | HR 72 | Wt 207.0 lb

## 2023-10-19 DIAGNOSIS — F410 Panic disorder [episodic paroxysmal anxiety] without agoraphobia: Secondary | ICD-10-CM | POA: Diagnosis not present

## 2023-10-19 DIAGNOSIS — F411 Generalized anxiety disorder: Secondary | ICD-10-CM | POA: Diagnosis not present

## 2023-10-19 MED ORDER — PROPRANOLOL HCL 10 MG PO TABS: 10.0000 mg | ORAL_TABLET | Freq: Two times a day (BID) | ORAL | 2 refills | Status: DC | PRN

## 2023-10-19 NOTE — Addendum Note (Signed)
 Addended by: CARVIN CROCK on: 10/19/2023 01:14 PM   Modules accepted: Level of Service

## 2023-10-28 DIAGNOSIS — Z79899 Other long term (current) drug therapy: Secondary | ICD-10-CM | POA: Diagnosis not present

## 2023-10-28 DIAGNOSIS — R41840 Attention and concentration deficit: Secondary | ICD-10-CM | POA: Diagnosis not present

## 2023-11-04 DIAGNOSIS — Z79899 Other long term (current) drug therapy: Secondary | ICD-10-CM | POA: Diagnosis not present

## 2023-11-04 DIAGNOSIS — R41840 Attention and concentration deficit: Secondary | ICD-10-CM | POA: Diagnosis not present
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
## 2023-11-07 ENCOUNTER — Ambulatory Visit (HOSPITAL_COMMUNITY): Admitting: Psychiatry

## 2023-11-07 VITALS — BP 135/95 | Wt 205.0 lb

## 2023-11-07 DIAGNOSIS — F411 Generalized anxiety disorder: Secondary | ICD-10-CM | POA: Diagnosis not present

## 2023-11-07 DIAGNOSIS — R41840 Attention and concentration deficit: Secondary | ICD-10-CM | POA: Diagnosis not present

## 2023-11-07 DIAGNOSIS — F410 Panic disorder [episodic paroxysmal anxiety] without agoraphobia: Secondary | ICD-10-CM

## 2023-11-07 DIAGNOSIS — Z79899 Other long term (current) drug therapy: Secondary | ICD-10-CM | POA: Diagnosis not present
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
## 2023-11-07 NOTE — Addendum Note (Signed)
 Addended by: CARVIN CROCK on: 11/07/2023 10:12 AM   Modules accepted: Level of Service

## 2023-11-14 ENCOUNTER — Ambulatory Visit (HOSPITAL_COMMUNITY): Admitting: Psychiatry

## 2023-11-14 DIAGNOSIS — F411 Generalized anxiety disorder: Secondary | ICD-10-CM

## 2023-11-14 DIAGNOSIS — F410 Panic disorder [episodic paroxysmal anxiety] without agoraphobia: Secondary | ICD-10-CM

## 2023-12-01 NOTE — Progress Notes (Unsigned)
 [facility] [address] [place] [address] [place] [address] Dept: [phone] Dept Fax: [phone]  Psychotherapy Progress Note  Patient ID: Joshua Page, male  DOB: June 10, 1975, 48 y.o.  MRN: [id number]  12/01/2023 Start time: 8:00am End time: 8:50am  Method of Visit: Face-to-Face  Present: patient  Current Concerns: anxiety  Current Symptoms: Anxiety  Psychiatric Specialty Exam: General Appearance: Casual  Eye Contact:  Good  Speech:  Clear and Coherent  Volume:  Normal  Mood:  Anxious  Affect:  Appropriate  Thought Process:  Coherent  Orientation:  Full (Time, Place, and Person)  Thought Content:  Logical  Suicidal Thoughts:  No  Homicidal Thoughts:  No  Memory:  Immediate;   Fair  Judgement:  Fair  Insight:  Good  Psychomotor Activity:  Normal  Concentration:  Concentration: Fair  Recall:  [employer] of Knowledge:Fair  Language: Fair  Akathisia:  No  Handed:  Unknown  AIMS (if indicated):  not done  Assets:  Communication Skills Desire for Improvement Financial Resources/Insurance Housing Intimacy Leisure Time Physical Health Resilience Social Support Talents/Skills Transportation Vocational/Educational  ADL's:  Intact  Cognition: WNL  Sleep:  Fair    Diagnosis: Generalized anxiety disorder with panic attacks, History of alcohol and nicotine abuse    Anticipated Frequency of Visits: every other week  Anticipated Length of Treatment Episode: 12 months   Short Term Goals/Goals for Treatment Session: build therapeutic alliance, formulate assessment   Progress Towards Goals: Initial  Today we discussed psychodynamic psychotherapy and went over patient's goals and domains of function.  Patient's goal is to get back to normal which he describes as a neutral state and a more positive mindset.  He also wants to try to get to a point where he is happy with himself.  We went over patient's  domains of functions including self, relationships, and his cognition.  Patient describes himself as having an addictive personality.  He likes the hunt and thrill of chasing a win.  He reports that when he was in college he had to be the best drinker.  He reports that he sees himself as thick skinned and does not show emotion to anybody, reports that he pushes it all down until it blows up.  He reports fears of failure.  He identifies his roles as being a father, and a peak performer at work.  He reports that he likes being at home and seeing his kids grow up.  He reports he has been in his 10-year career travels for work and that it can be lonely and boring.  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  He reports that he is thinking about retiring in about 9 years.  But he wants to get motivated again and he does not enjoy work is much as he used to which he partly attributes to his preference for the thrill of getting to the next goal versus actually being there.  We discussed multiple relationships that he had.  He has 5 children.  He has a 44 year old son and 49 year old daughter from a previous marriage.  He also has 3 children with his current wife 1 of whom was born with a rare genetic disorder and another one who required a craniotomy.  These children are 17, 16 and 12.  He identifies his important relationships as being with his wife who he has  known for 21 years, his uncle which is his mom's brother whom he sees as a big brother.  His in-laws that he have known about the same time as his wife, his mother whom he is not talking to who lives in [place] .  He also has 2 younger brothers and a younger sister.  One of his brothers is named Hosey and he is 30 and lives in [place] and his 2 daughters, his other younger brothers Redell who is 69 years old lives in [place] and has 3 children, and then he has a sister who is single in  [place] .  He identifies as a sole provider of the family and growing up financially unstable which is why he often feels like he can't fail.  He states that his difficulties started when his stepfather passed away in 12/13/2016.  He reports that at that time he had panic attacks.  He reports his stepfather had congestive heart failure and not good health however the death was somewhat unanticipated.  He reports at that time he had increased fear of to death.  He reports he was previously in the [employer] and felt indestructible so this was a reminder of his mortality. He reports a tenuous relationship with his stepfather, states that his stepfather who adopted him and his other brother Hosey treated them differently from his other brother Redell.  He reports at that time he went to therapy and learn to CBT and he found the breathing exercises most helpful in CBT.  He reports that things are going okay until he drink at his daughter's wedding which disrupted his relationship with his wife.  He had been sober for 11 years until his daughter's wedding.  His wife had a brain tumor that was diagnosed during COVID and half of her face is paralyzed and she is deaf in one ear.   We discussed his prior alcohol use.  He reports that he started drinking at 64 to 48 years old.  He was part of a rebellious trip in high school and could out drink everyone else.  He reports that his biological father was an alcoholic had bipolar and schizophrenia.  He was adopted by his stepfather.  He reports for every bad decision that he had drinking was involved.  He reports that he had a DUI in his early 75s and had to go to classes for that.  He reports he was not an everyday drinker, but would often drink on the weekends.  He identifies drinking to deal with his emotions of feeling mad and angry and pissed off at the world.  He reports when he drinks hard liquors when it gets worse.  He reports that 10 to 11 years ago he was at a work party  and he was drinking too much.  He reports that at that time he was also dealing with the health issues of his daughter and was using drinking as an outlet.  He reports that his wife had to pick him up from his car as he was on the side of the road and she told him that he needed to get help or that she was out and it put a damper on their marriage.  He reports that things were going okay after he did therapy but then at the daughter's wedding he drank again.  He reports that he broke his wife stress because he did not tell her that he had drank again and was not forthcoming about having the  glasses of wine and she knew something had happened.  He reports then 6 months later he was using nicotine patches and did not tell her and she found out about it.  He reports that he was trying to avoid the situation of telling her.  He reports that he used nicotine to take the edge off.  At the end of the session, patient identified that the session made him feel frustrated at himself as he reflects on who he actually is versus who he wants to be and that there are components of himself that he doesn't like. He reports that he is happy that he is not like either of his fathers but he is upset that he hurt his wife and she is part of the reason why he is seeking therapy.   This is a 48 year old male who struggles with anxiety which is partly thought to be influenced by financial instability in childhood as well as inability to express his emotions which then led to increased drinking.  Unfortunately this difficulty with identifying and expressing his emotions has led to difficulties and conflict with his a relationship with his wife which is what has prompted him to seek treatment.  Biologically, patient is predisposed to substance use given his biological family.  Psychologically, patient has done well in managing his anxiety and utilizing it for his work, however suspect that he is repressing his emotions and using  substances in the past to avoid difficult emotions including facing his own mortality, difficulties with his wife's health and his children's health.   Treatment Intervention: Other: Psychodynamic psychotherapy   Medical Necessity: Improved patient condition  Assessment Tools:    09/19/2023    8:55 AM 04/04/2023    8:25 AM 09/30/2022    3:14 PM  Depression screen PHQ 2/9  Decreased Interest 0 0 0  Down, Depressed, Hopeless 0 0 0  PHQ - 2 Score 0 0 0  Altered sleeping   1  Tired, decreased energy   0  Change in appetite   0  Feeling bad or failure about yourself    0  Trouble concentrating   1  Moving slowly or fidgety/restless   0  Suicidal thoughts   0  PHQ-9 Score   2  Difficult doing work/chores   Not difficult at all   Failed to redirect to the Timeline version of the REVFS SmartLink.   Collaboration of Care: Other psychologist  Patient/Guardian was advised Release of Information must be obtained prior to any record release in order to collaborate their care with an outside provider. Patient/Guardian was advised if they have not already done so to contact the registration department to sign all necessary forms in order for us  to release information regarding their care.   Consent: Patient/Guardian gives verbal consent for treatment and assignment of benefits for services provided during this visit. Patient/Guardian expressed understanding and agreed to proceed.   Plan:  -discussed with patient to work on identifying his emotional response to different situations  -continue to explore early childhood attachments and relationship with biological father and stepfather and with mother  -increase patient insight into his avoidance behaviors including avoidance of being upfront with his wife and his own emotions   Corean Minor, MD, PGY-3 12/01/2023

## 2023-12-05 ENCOUNTER — Ambulatory Visit (HOSPITAL_COMMUNITY): Admitting: Psychiatry

## 2023-12-05 DIAGNOSIS — F410 Panic disorder [episodic paroxysmal anxiety] without agoraphobia: Secondary | ICD-10-CM | POA: Diagnosis not present

## 2023-12-05 DIAGNOSIS — F411 Generalized anxiety disorder: Secondary | ICD-10-CM

## 2023-12-26 ENCOUNTER — Encounter: Payer: Self-pay | Admitting: Family Medicine

## 2023-12-26 ENCOUNTER — Ambulatory Visit: Admitting: Family Medicine

## 2023-12-26 VITALS — BP 110/75 | HR 65 | Temp 97.9°F | Ht 71.5 in | Wt 215.0 lb

## 2023-12-26 DIAGNOSIS — I999 Unspecified disorder of circulatory system: Secondary | ICD-10-CM | POA: Diagnosis not present

## 2023-12-26 DIAGNOSIS — Z79899 Other long term (current) drug therapy: Secondary | ICD-10-CM | POA: Diagnosis not present

## 2023-12-26 DIAGNOSIS — Z23 Encounter for immunization: Secondary | ICD-10-CM | POA: Diagnosis not present

## 2023-12-26 DIAGNOSIS — R7301 Impaired fasting glucose: Secondary | ICD-10-CM | POA: Diagnosis not present

## 2023-12-26 DIAGNOSIS — F411 Generalized anxiety disorder: Secondary | ICD-10-CM

## 2023-12-26 LAB — HEMOGLOBIN A1C: Hgb A1c MFr Bld: 5.9 % (ref 4.6–6.5)

## 2023-12-26 LAB — BASIC METABOLIC PANEL WITH GFR
BUN: 13 mg/dL (ref 6–23)
CO2: 32 meq/L (ref 19–32)
Calcium: 9.4 mg/dL (ref 8.4–10.5)
Chloride: 103 meq/L (ref 96–112)
Creatinine, Ser: 0.88 mg/dL (ref 0.40–1.50)
GFR: 102.87 mL/min (ref >60.00)
Glucose, Bld: 111 mg/dL — ABNORMAL HIGH (ref 70–99)
Potassium: 4.5 meq/L (ref 3.5–5.1)
Sodium: 142 meq/L (ref 135–145)

## 2023-12-26 NOTE — Progress Notes (Signed)
 OFFICE VISIT  12/26/2023  CC:  Chief Complaint  Patient presents with   Medical Management of Chronic Issues    Patient is a 48 y.o. male who presents for 50-month follow-up weight management, prediabetes, and anxiety. A/P as of last visit: #1 health maintenance exam: Reviewed age and gender appropriate health maintenance issues (prudent diet, regular exercise, health risks of tobacco and excessive alcohol, use of seatbelts, fire alarms in home, use of sunscreen).  Also reviewed age and gender appropriate health screening as well as vaccine recommendations. Vaccines: all utd Labs: Fasting health panel and hemoglobin A1c and PSA. Prostate ca screening: PSA today Colon ca screening: Adenomatous polyps on colonoscopy 2023.  Recall 5 to 7 years.   #2 weight management. Increase Wegovy  to 1 mg every week. Follow-up 3 months.   #3 generalized anxiety disorder. History of panic attacks as well. Continue lorazepam  0.5 mg, 1 to 2 tablets twice daily as needed, #90, refill x 1. Urine drug screen today. He is established with psychiatry.  He will start sertraline  50 mg daily when he is done with his upcoming travel.  INTERIM HX: He is feeling like he is doing pretty good lately. He has not taken his Wegovy  in about a month, too cost prohibitive at this point.  He is concerned because the veins on his right lower leg have been more noticeable in the last 6 months or so.  They seem to be more prominent-appearing.  Occurs intermittently.  No pain or redness or swelling of the leg.  He has started seeing a psychiatrist and is now taking sertraline .  He feels like he is getting better.   Anxiety levels pretty stable. PMP AWARE reviewed today: most recent rx for lorazepam  was filled 09/19/2023, # 90, rx by me. No red flags.  Past Medical History:  Diagnosis Date   BPH with obstruction/lower urinary tract symptoms 2018   Pt declined bph med as of 06/2016   Cutaneous abscess of face 04/2017    Disequilibrium syndrome    Elevated blood pressure reading without diagnosis of hypertension    Elevated transaminase level 09/10/2016   [facility] wellness labs--AST 41, ALT 76.   Viral Hep screening NEG.   GAD (generalized anxiety disorder)    Hay fever    Hepatic steatosis    u/s 09/2016   History of adenomatous polyp of colon    04/2022->recall 5-7 yrs   Prediabetes    via work labs 2017/18; A1c 6.2% here 05/2016.  Improved to 5.7% 08/2016.  Then, A1c via [facility] on 09/10/16 was 6.1%.  A1c 6.2% July 2019-->TLC. 6.2% 07/2019. 5.9% 09/2020    Past Surgical History:  Procedure Laterality Date   COLONOSCOPY W/ POLYPECTOMY     2024 Adenoma x 2-recall 5-7 yrs ([facility])   HOLTER MONITOR  09/2016   NSR with occ sinus brady, rare PVC.    Outpatient Medications Prior to Visit  Medication Sig Dispense Refill   aspirin  81 MG chewable tablet Chew 1 tablet (81 mg total) by mouth daily. 30 tablet 1   LORazepam  (ATIVAN ) 0.5 MG tablet TAKE 1 TO 2 TABLETS BY MOUTH TWICE A DAY AS NEEDED FOR ANXIETY/STRESS (Patient taking differently: as needed. TAKE 1 TO 2 TABLETS BY MOUTH TWICE A DAY AS NEEDED FOR ANXIETY/STRESS) 90 tablet 1   sertraline  (ZOLOFT ) 25 MG tablet Take 1 tablet (25 mg total) by mouth at bedtime. 90 tablet 0   albuterol  (VENTOLIN  HFA) 108 (90 Base) MCG/ACT inhaler TAKE 2  PUFFS BY MOUTH EVERY 6 HOURS AS NEEDED FOR WHEEZE OR SHORTNESS OF BREATH (Patient not taking: Reported on 12/26/2023) 18 each 0   propranolol  (INDERAL ) 10 MG tablet Take 1 tablet (10 mg total) by mouth 2 (two) times daily as needed (anxiety, panic attacks, sleep). (Patient not taking: Reported on 12/26/2023) 60 tablet 2   Semaglutide -Weight Management (WEGOVY ) 1 MG/0.5ML SOAJ Inject 1 mg into the skin once a week. (Patient not taking: Reported on 12/26/2023) 2 mL 2   No facility-administered medications prior to visit.    No Known Allergies  Review of Systems As per HPI  PE:    12/26/2023    8:08 AM 11/07/2023     9:32 AM 10/19/2023    8:55 AM  Vitals with BMI  Height 5' 11.5    Weight 215 lbs    BMI 29.57    Systolic 110    Diastolic 75    Pulse 65       Information is confidential and restricted. Go to Review Flowsheets to unlock data.     Physical Exam  Gen: Alert, well appearing.  Patient is oriented to person, place, time, and situation. AFFECT: pleasant, lucid thought and speech. Both lower legs are without any prominent veins at this time.  No erythema or tenderness.  No edema or asymmetry to the calves.  LABS:  Last CBC Lab Results  Component Value Date   WBC 4.6 09/19/2023   HGB 15.4 09/19/2023   HCT 45.8 09/19/2023   MCV 89.1 09/19/2023   MCH 30.2 07/22/2016   RDW 13.6 09/19/2023   PLT 237.0 09/19/2023   Last metabolic panel Lab Results  Component Value Date   GLUCOSE 96 09/19/2023   NA 141 09/19/2023   K 3.9 09/19/2023   CL 104 09/19/2023   CO2 30 09/19/2023   BUN 9 09/19/2023   CREATININE 0.78 09/19/2023   GFR 106.89 09/19/2023   CALCIUM 9.1 09/19/2023   PROT 6.9 09/19/2023   ALBUMIN 4.5 09/19/2023   BILITOT 0.7 09/19/2023   ALKPHOS 43 09/19/2023   AST 25 09/19/2023   ALT 44 09/19/2023   ANIONGAP 8 07/22/2016   Last lipids Lab Results  Component Value Date   CHOL 148 09/19/2023   HDL 45.90 09/19/2023   LDLCALC 90 09/19/2023   TRIG 62.0 09/19/2023   CHOLHDL 3 09/19/2023   Last hemoglobin A1c Lab Results  Component Value Date   HGBA1C 5.9 09/19/2023   Last thyroid  functions Lab Results  Component Value Date   TSH 1.55 09/19/2023   Lab Results  Component Value Date   PSA 0.48 09/19/2023   IMPRESSION AND PLAN:  #1 prediabetes. Working on [employer]. No longer taking Wegovy --> too cost prohibitive. Monitor fasting glucose and hemoglobin A1c today.  2.  Generalized anxiety disorder. Psychiatry is treating him with sertraline . I prescribed him lorazepam  0.5 mg, 1-2 twice daily as needed.  He did not need a new prescription today.  #3 leg veins  prominent. Intermittent.  Not apparent today. No sign of DVT. Monitor. Reassured patient at this time.  An After Visit Summary was printed and given to the patient.  FOLLOW UP: Return in about 6 months (around 06/24/2024) for routine chronic illness f/u. Next CPE 09/2024  Signed:  Gerlene Hockey, MD           12/26/2023

## 2023-12-27 ENCOUNTER — Ambulatory Visit: Payer: Self-pay | Admitting: Family Medicine

## 2023-12-30 NOTE — Progress Notes (Signed)
 [facility] [address] [place] [address] [place] [address] Dept: [phone] Dept Fax: [phone]  Psychotherapy Progress Note  Patient ID: Hikaru Delorenzo, male  DOB: [birth year], 48 y.o.  MRN: [id number]  01/02/2024 Start time: 8:00am End time: 8:50am  Method of Visit: Face-to-Face  Present: patient  Current Concerns: anxiety  Current Symptoms: Anxiety  Psychiatric Specialty Exam: General Appearance: Casual  Eye Contact:  Good  Speech:  Clear and Coherent  Volume:  Normal  Mood:  Anxious  Affect:  Appropriate  Thought Process:  Coherent  Orientation:  Full (Time, Place, and Person)  Thought Content:  Logical  Suicidal Thoughts:  No  Homicidal Thoughts:  No  Memory:  Immediate;   Fair  Judgement:  Fair  Insight:  Good  Psychomotor Activity:  Normal  Concentration:  Concentration: Fair  Recall:  [employer] of Knowledge:Fair  Language: Fair  Akathisia:  No  Handed:  Unknown  AIMS (if indicated):  not done  Assets:  Communication Skills Desire for Improvement Financial Resources/Insurance Housing Intimacy Leisure Time Physical Health Resilience Social Support Talents/Skills Transportation Vocational/Educational  ADL's:  Intact  Cognition: WNL  Sleep:  Fair    Diagnosis: Generalized anxiety disorder with panic attacks, History of alcohol and nicotine abuse    Anticipated Frequency of Visits: every other week  Anticipated Length of Treatment Episode: 12 months   Short Term Goals/Goals for Treatment Session: work on uncovering patient's past and his emotions associated with this  Progress Towards Goals: Progressing as evidenced by patient's ability to recognize that he has avoided being vulnerable with others   Today we discussed patient's inability express his emotions. He reports avoiding his emotions as well as a desire to avoid other people's emotions. We discussed how his inability to  express his emotions has been influenced by his relationship with his stepdad and his mom's inability to manage emotions as well. We explored how his early relationship with his stepdad led to suppression of his emotions due to being taught that crying is a weakness, his stepdad stating don't be a wimp when emotions were expressed, and him and his brother Hosey being treated as extra baggage in the family. Through our discussion of his relationship with his stepdad, he recognized that he had difficulty with showing emotions to his stepdad because he felt that when he would show him weakness, it gave his stepdad strength over him. We also explored his relationship with his mom whom he identified as someone who would try to patch the situation up but would not address the root cause. This has affected his relationship with his wife because he wants to show his wife a specific version of him and is afraid of her viewing him in a bad light. As we explored these relationships and his own relationship with emotion, he was able to identify that he is afraid of showing emotion due to being looked upon as having a weakness. Discussed that while it may have been protective for him as a younger child, it seems to be creating more difficulties now which he identified with. When asked if he feels that everyone has weaknesses, he was able to identify that he does feel that others have weaknesses and when others have shared with him he does not always view them differently. Challenged patient to have moments of vulnerability with others in his life.   In the session patient also brought up increased psychomotor restlessness within the past month  which he was concerned about being a side effect to zoloft . Eventually he also brought up that he has been drinking more caffeine. We discussed decreasing his afternoon coffee intake to monitor improvement in his psychomotor restlessness and will evaluate at next session.   This is a  48 year old male who struggles with anxiety which is partly thought to be influenced by financial instability in childhood as well as inability to express his emotions which then led to increased drinking.  Unfortunately this difficulty with identifying and expressing his emotions has led to difficulties and conflict with his a relationship with his wife which is what has prompted him to seek treatment.  Biologically, patient is predisposed to substance use given his biological family.  Psychologically, patient has done well in managing his anxiety and utilizing it for his work, however suspect that he is repressing his emotions and using substances in the past to avoid difficult emotions including facing his own mortality, difficulties with his wife's health and his children's health, and reconciling who he is now with his difficult childhood.   Treatment Intervention: Other: Psychodynamic psychotherapy   Medical Necessity: Improved patient condition  Assessment Tools:    12/26/2023    8:10 AM 09/19/2023    8:55 AM 04/04/2023    8:25 AM  Depression screen PHQ 2/9  Decreased Interest 0 0 0  Down, Depressed, Hopeless 0 0 0  PHQ - 2 Score 0 0 0  Altered sleeping 1    Tired, decreased energy 1    Change in appetite 1    Feeling bad or failure about yourself  0    Trouble concentrating 0    Moving slowly or fidgety/restless 1    Suicidal thoughts 0    PHQ-9 Score 4    Difficult doing work/chores Not difficult at all     Failed to redirect to the Timeline version of the REVFS SmartLink.   Collaboration of Care: Other psychologist  Patient/Guardian was advised Release of Information must be obtained prior to any record release in order to collaborate their care with an outside provider. Patient/Guardian was advised if they have not already done so to contact the registration department to sign all necessary forms in order for us  to release information regarding their care.   Consent:  Patient/Guardian gives verbal consent for treatment and assignment of benefits for services provided during this visit. Patient/Guardian expressed understanding and agreed to proceed.   Plan:  -Patient to work on being vulnerable with at least one person in his circle (wife, uncle) prior to next session and discuss how the situation went  -will further explore relationship with biological father and with mother  -increase patient insight into his avoidance behaviors including avoidance of being upfront with his own emotions  Corean Minor, MD, PGY-3 01/02/2024

## 2024-01-01 ENCOUNTER — Other Ambulatory Visit (HOSPITAL_COMMUNITY): Payer: Self-pay | Admitting: Psychiatry

## 2024-01-01 DIAGNOSIS — F410 Panic disorder [episodic paroxysmal anxiety] without agoraphobia: Secondary | ICD-10-CM

## 2024-01-02 ENCOUNTER — Other Ambulatory Visit (HOSPITAL_COMMUNITY): Payer: Self-pay | Admitting: Psychiatry

## 2024-01-02 ENCOUNTER — Ambulatory Visit (HOSPITAL_COMMUNITY): Admitting: Psychiatry

## 2024-01-02 DIAGNOSIS — F410 Panic disorder [episodic paroxysmal anxiety] without agoraphobia: Secondary | ICD-10-CM

## 2024-01-02 DIAGNOSIS — F411 Generalized anxiety disorder: Secondary | ICD-10-CM

## 2024-01-02 MED ORDER — SERTRALINE HCL 25 MG PO TABS: 25.0000 mg | ORAL_TABLET | Freq: Every day | ORAL | 0 refills | Status: AC

## 2024-01-02 NOTE — Progress Notes (Signed)
 Sent in 90 day refill of zoloft , patient reports not taking hydroxyzine  so did not send refill of hydroxyzine .

## 2024-01-16 NOTE — Progress Notes (Unsigned)
 [facility] [address] [place] [address] Dept: [phone] Dept Fax: [phone]  Psychotherapy Progress Note  Patient ID: Joshua Page, male  DOB: Jul 12, 1975, 48 y.o.  MRN: [id number]  01/22/2024 Start time: 8:00am End time: 8:50am  Method of Visit: Face-to-Face  Present: patient  Current Concerns: anxiety  Current Symptoms: Anxiety  Psychiatric Specialty Exam: General Appearance: Casual  Eye Contact:  Good  Speech:  Clear and Coherent  Volume:  Normal  Mood:  Anxious  Affect:  Appropriate  Thought Process:  Coherent  Orientation:  Full (Time, Place, and Person)  Thought Content:  Logical  Suicidal Thoughts:  No  Homicidal Thoughts:  No  Memory:  Immediate;   Fair  Judgement:  Fair  Insight:  Good  Psychomotor Activity:  Normal  Concentration:  Concentration: Fair  Recall:  [employer] of Knowledge:Fair  Language: Fair  Akathisia:  No  Handed:  Unknown  AIMS (if indicated):  not done  Assets:  Communication Skills Desire for Improvement Financial Resources/Insurance Housing Intimacy Leisure Time Physical Health Resilience Social Support Talents/Skills Transportation Vocational/Educational  ADL's:  Intact  Cognition: WNL  Sleep:  Fair    Diagnosis: Generalized anxiety disorder with panic attacks, History of alcohol and nicotine abuse    Anticipated Frequency of Visits: every other week  Anticipated Length of Treatment Episode: 12 months   Short Term Goals/Goals for Treatment Session: work on uncovering patient's past and his emotions associated with this  Progress Towards Goals: Progressing as evidenced by patient's ability to recognize that he has avoided being vulnerable with others   Today we discussed patient's inability express his emotions. He reports avoiding his emotions as well as a desire to avoid other people's emotions. We discussed how his inability to  express his emotions has been influenced by his relationship with his stepdad and his mom's inability to manage emotions as well. We explored how his early relationship with his stepdad led to suppression of his emotions due to being taught that crying is a weakness, his stepdad stating don't be a wimp when emotions were expressed, and him and his brother Hosey being treated as extra baggage in the family. Through our discussion of his relationship with his stepdad, he recognized that he had difficulty with showing emotions to his stepdad because he felt that when he would show him weakness, it gave his stepdad strength over him. We also explored his relationship with his mom whom he identified as someone who would try to patch the situation up but would not address the root cause. This has affected his relationship with his wife because he wants to show his wife a specific version of him and is afraid of her viewing him in a bad light. As we explored these relationships and his own relationship with emotion, he was able to identify that he is afraid of showing emotion due to being looked upon as having a weakness. Discussed that while it may have been protective for him as a younger child, it seems to be creating more difficulties now which he identified with. When asked if he feels that everyone has weaknesses, he was able to identify that he does feel that others have weaknesses and when others have shared with him he does not always view them differently. Challenged patient to have moments of vulnerability with others in his life.   In the session patient also brought up increased psychomotor restlessness within the past month  which he was concerned about being a side effect to zoloft . Eventually he also brought up that he has been drinking more caffeine. We discussed decreasing his afternoon coffee intake to monitor improvement in his psychomotor restlessness and will evaluate at next session.   This is a  48 year old male who struggles with anxiety which is partly thought to be influenced by financial instability in childhood as well as inability to express his emotions which then led to increased drinking.  Unfortunately this difficulty with identifying and expressing his emotions has led to difficulties and conflict with his a relationship with his wife which is what has prompted him to seek treatment.  Biologically, patient is predisposed to substance use given his biological family.  Psychologically, patient has done well in managing his anxiety and utilizing it for his work, however suspect that he is repressing his emotions and using substances in the past to avoid difficult emotions including facing his own mortality, difficulties with his wife's health and his children's health, and reconciling who he is now with his difficult childhood.   Treatment Intervention: Other: Psychodynamic psychotherapy   Medical Necessity: Improved patient condition  Assessment Tools:    12/26/2023    8:10 AM 09/19/2023    8:55 AM 04/04/2023    8:25 AM  Depression screen PHQ 2/9  Decreased Interest 0 0 0  Down, Depressed, Hopeless 0 0 0  PHQ - 2 Score 0 0 0  Altered sleeping 1    Tired, decreased energy 1    Change in appetite 1    Feeling bad or failure about yourself  0    Trouble concentrating 0    Moving slowly or fidgety/restless 1    Suicidal thoughts 0    PHQ-9 Score 4    Difficult doing work/chores Not difficult at all     Failed to redirect to the Timeline version of the REVFS SmartLink.   Collaboration of Care: Other psychologist  Patient/Guardian was advised Release of Information must be obtained prior to any record release in order to collaborate their care with an outside provider. Patient/Guardian was advised if they have not already done so to contact the registration department to sign all necessary forms in order for us  to release information regarding their care.   Consent:  Patient/Guardian gives verbal consent for treatment and assignment of benefits for services provided during this visit. Patient/Guardian expressed understanding and agreed to proceed.   Plan:  -Patient to work on being vulnerable with at least one person in his circle (wife, uncle) prior to next session and discuss how the situation went  -will further explore relationship with biological father and with mother  -increase patient insight into his avoidance behaviors including avoidance of being upfront with his own emotions  Corean Minor, MD, PGY-3 01/22/2024

## 2024-01-19 ENCOUNTER — Other Ambulatory Visit (HOSPITAL_COMMUNITY): Payer: Self-pay | Admitting: Psychiatry

## 2024-01-19 DIAGNOSIS — F410 Panic disorder [episodic paroxysmal anxiety] without agoraphobia: Secondary | ICD-10-CM

## 2024-01-23 ENCOUNTER — Ambulatory Visit (HOSPITAL_COMMUNITY): Admitting: Psychiatry

## 2024-01-23 DIAGNOSIS — F410 Panic disorder [episodic paroxysmal anxiety] without agoraphobia: Secondary | ICD-10-CM

## 2024-01-23 NOTE — Addendum Note (Signed)
 Addended by: CARVIN CROCK on: 01/23/2024 01:03 PM   Modules accepted: Level of Service
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
## 2024-01-30 ENCOUNTER — Ambulatory Visit (HOSPITAL_COMMUNITY): Admitting: Psychiatry

## 2024-01-30 DIAGNOSIS — F411 Generalized anxiety disorder: Secondary | ICD-10-CM

## 2024-01-30 DIAGNOSIS — F410 Panic disorder [episodic paroxysmal anxiety] without agoraphobia: Secondary | ICD-10-CM

## 2024-01-30 NOTE — Addendum Note (Signed)
 Addended by: CARVIN CROCK on: 01/30/2024 01:02 PM   Modules accepted: Level of Service

## 2024-03-01 NOTE — Progress Notes (Deleted)
 [facility] [address] [place] [address] [place] [address] Dept: [phone] Dept Fax: [phone]  Psychotherapy Progress Note  Patient ID: Joshua Page, male  DOB: 11-08-1975, 49 y.o.  MRN: [id number]  03/01/2024 Start time: 8:00am End time: 8:50am  Method of Visit: Face-to-Face  Present: patient  Current Concerns: anxiety  Current Symptoms: Anxiety  Psychiatric Specialty Exam: General Appearance: Casual  Eye Contact:  Good  Speech:  Clear and Coherent  Volume:  Normal  Mood:  Anxious  Affect:  Appropriate  Thought Process:  Coherent  Orientation:  Full (Time, Place, and Person)  Thought Content:  Logical  Suicidal Thoughts:  No  Homicidal Thoughts:  No  Memory:  Immediate;   Fair  Judgement:  Fair  Insight:  Good  Psychomotor Activity:  Normal  Concentration:  Concentration: Fair  Recall:  [employer] of Knowledge:Fair  Language: Fair  Akathisia:  No  Handed:  Unknown  AIMS (if indicated):  not done  Assets:  Communication Skills Desire for Improvement Financial Resources/Insurance Housing Intimacy Leisure Time Physical Health Resilience Social Support Talents/Skills Transportation Vocational/Educational  ADL's:  Intact  Cognition: WNL  Sleep:  Fair    Diagnosis: Generalized anxiety disorder with panic attacks, History of alcohol and nicotine abuse    Anticipated Frequency of Visits: every other week  Anticipated Length of Treatment Episode: 12 months   Short Term Goals/Goals for Treatment Session: work on uncovering patient's past and his emotions associated with this  Progress Towards Goals: Progressing as evidenced by patient's ability to be vulnerable with others    Today patient first brought up medications. He reports that he switched zoloft  to the morning and has noticed more difficulty with concentrating and feeling more tired and less focused. He reports it has been 3 weeks  since he switched to the morning. He reports he has decreased his caffeine intake so that he is not using any past noon and he is mostly sleeping through the night but 2/7 of the nights he is still feeling restless. We discussed continuing medication regimen for now and will re-evaluate at next med management visit.   He reports he worked on being more vulnerable with his wife and sharing more of his history with her as well as his defence mechanisms. He reports it felt like a solid conversation and he feels thankful to be able to communicate with her. We explored his relationship with his biological father and he reports difficulty with trusting his biological father due to times when his father his let him down especially with his kids and he feels closure from the relationship with his father and not having a relationship with him. We then discussed his relationship with his mother where he reported more difficulties since they used to be close but there have been times when she has betrayed his trust by lying to him and making poor financial decisions. He reports both guilt and an expectation from himself that he should be supporting his mom financially no matter what. We discussed the different types of trust that you can have in people. He also reports that he is unsure about his boundaries with his mom. Encouraged him to consider the different types of trust that he can have in his mom and the boundaries that he wants with him prior to next session.   This is a 49 year old male who struggles with anxiety which is partly thought to be influenced by financial instability in childhood  as well as inability to express his emotions which then led to increased drinking.  Unfortunately this difficulty with identifying and expressing his emotions has led to difficulties and conflict with his a relationship with his wife which is what has prompted him to seek treatment.  Biologically, patient is predisposed to  substance use given his biological family.  Psychologically, patient has done well in managing his anxiety and utilizing it for his work, however suspect that he is repressing his emotions and using substances in the past to avoid difficult emotions including facing his own mortality, difficulties with his wife's health and his children's health, and reconciling who he is now with his difficult childhood.   Treatment Intervention: Other: Psychodynamic psychotherapy   Medical Necessity: Improved patient condition  Assessment Tools:    12/26/2023    8:10 AM 09/19/2023    8:55 AM 04/04/2023    8:25 AM  Depression screen PHQ 2/9  Decreased Interest 0 0 0  Down, Depressed, Hopeless 0 0 0  PHQ - 2 Score 0 0 0  Altered sleeping 1    Tired, decreased energy 1    Change in appetite 1    Feeling bad or failure about yourself  0    Trouble concentrating 0    Moving slowly or fidgety/restless 1    Suicidal thoughts 0    PHQ-9 Score 4    Difficult doing work/chores Not difficult at all     Failed to redirect to the Timeline version of the REVFS SmartLink.   Collaboration of Care: Other psychiatrist  Patient/Guardian was advised Release of Information must be obtained prior to any record release in order to collaborate their care with an outside provider. Patient/Guardian was advised if they have not already done so to contact the registration department to sign all necessary forms in order for us  to release information regarding their care.   Consent: Patient/Guardian gives verbal consent for treatment and assignment of benefits for services provided during this visit. Patient/Guardian expressed understanding and agreed to proceed.   Plan:  -Patient to work on continuing to be vulnerable with his wife and family -determine the types of trust and boundaries that he wants to have with his mom -increase patient insight into his avoidance behaviors including avoidance of being upfront with his own  emotions  Corean Minor, MD, PGY-3 03/01/2024

## 2024-03-05 ENCOUNTER — Encounter (HOSPITAL_COMMUNITY): Admitting: Psychiatry

## 2024-03-05 DIAGNOSIS — F410 Panic disorder [episodic paroxysmal anxiety] without agoraphobia: Secondary | ICD-10-CM

## 2024-03-06 ENCOUNTER — Ambulatory Visit: Admitting: Family Medicine

## 2024-03-06 ENCOUNTER — Encounter: Payer: Self-pay | Admitting: Family Medicine

## 2024-03-06 VITALS — BP 114/75 | HR 68 | Temp 97.8°F | Ht 71.5 in | Wt 219.8 lb

## 2024-03-06 DIAGNOSIS — Z7985 Long-term (current) use of injectable non-insulin antidiabetic drugs: Secondary | ICD-10-CM | POA: Diagnosis not present

## 2024-03-06 DIAGNOSIS — Z79899 Other long term (current) drug therapy: Secondary | ICD-10-CM

## 2024-03-06 DIAGNOSIS — F411 Generalized anxiety disorder: Secondary | ICD-10-CM | POA: Diagnosis not present

## 2024-03-06 DIAGNOSIS — E119 Type 2 diabetes mellitus without complications: Secondary | ICD-10-CM

## 2024-03-06 DIAGNOSIS — E1165 Type 2 diabetes mellitus with hyperglycemia: Secondary | ICD-10-CM

## 2024-03-06 MED ORDER — WEGOVY 1.5 MG PO TABS: 1.5000 mg | ORAL_TABLET | Freq: Every day | ORAL | 0 refills | Status: AC

## 2024-03-06 MED ORDER — LORAZEPAM 0.5 MG PO TABS: ORAL_TABLET | ORAL | 1 refills | Status: AC

## 2024-03-06 NOTE — Patient Instructions (Addendum)
 Joshua Page

## 2024-03-06 NOTE — Progress Notes (Signed)
 OFFICE VISIT  03/06/2024  CC:  Chief Complaint  Patient presents with   Medical Management of Chronic Issues    Pt stopped taking Wegovy  near Thanksgiving, using a CGM, has had spikes in glucose. Potentially looking into Wegovy  pills    Patient is a 49 y.o. male who presents for 2-1/39-month follow-up weight management, prediabetes, and anxiety. A/P as of last visit: #1 prediabetes. Working on [employer]. No longer taking Wegovy --> too cost prohibitive. Monitor fasting glucose and hemoglobin A1c today.   2.  Generalized anxiety disorder. Psychiatry is treating him with sertraline . I prescribed him lorazepam  0.5 mg, 1-2 twice daily as needed.  He did not need a new prescription today.   #3 leg veins prominent. Intermittent.  Not apparent today. No sign of DVT. Monitor. Reassured patient at this time.  INTERIM HX: Joshua Page is having high sugars at home. He discontinued his Wegovy  injections about 2 months ago.  He has been monitoring glucoses with CGM and confirming with periodic fingerstick glucoses--> consistently 135-140 fasting glucose.  He has significant postprandial hyperglycemia, sometimes up into the 200-250 range.   He does feel mild polydipsia, polyuria, and polyphagia.  His hemoglobin A1c was 5.9% at last visit less than 3 months ago.  He uses his lorazepam  exclusively for airplane flights/travel anxiety. PMP AWARE reviewed today: most recent rx for lorazepam  was filled 09/19/2023, # 90, rx by me. No red flags.  Past Medical History:  Diagnosis Date   BPH with obstruction/lower urinary tract symptoms 2018   Pt declined bph med as of 06/2016   Cutaneous abscess of face 04/2017   Disequilibrium syndrome    Elevated blood pressure reading without diagnosis of hypertension    Elevated transaminase level 09/10/2016   [facility] wellness labs--AST 41, ALT 76.   Viral Hep screening NEG.   GAD (generalized anxiety disorder)    Hay fever    Hepatic steatosis    u/s 09/2016   History  of adenomatous polyp of colon    04/2022->recall 5-7 yrs   Prediabetes    via work labs 2017/18; A1c 6.2% here 05/2016.  Improved to 5.7% 08/2016.  Then, A1c via [facility] on 09/10/16 was 6.1%.  A1c 6.2% July 2019-->TLC. 6.2% 07/2019. 5.9% 09/2020    Past Surgical History:  Procedure Laterality Date   COLONOSCOPY W/ POLYPECTOMY     2024 Adenoma x 2-recall 5-7 yrs ([facility])   HOLTER MONITOR  09/2016   NSR with occ sinus brady, rare PVC.    Outpatient Medications Prior to Visit  Medication Sig Dispense Refill   albuterol  (VENTOLIN  HFA) 108 (90 Base) MCG/ACT inhaler TAKE 2 PUFFS BY MOUTH EVERY 6 HOURS AS NEEDED FOR WHEEZE OR SHORTNESS OF BREATH (Patient taking differently: as needed.) 18 each 0   aspirin  81 MG chewable tablet Chew 1 tablet (81 mg total) by mouth daily. 30 tablet 1   LORazepam  (ATIVAN ) 0.5 MG tablet TAKE 1 TO 2 TABLETS BY MOUTH TWICE A DAY AS NEEDED FOR ANXIETY/STRESS (Patient taking differently: as needed. TAKE 1 TO 2 TABLETS BY MOUTH TWICE A DAY AS NEEDED FOR ANXIETY/STRESS) 90 tablet 1   propranolol  (INDERAL ) 10 MG tablet TAKE 1 TABLET (10 MG TOTAL) BY MOUTH 2 (TWO) TIMES DAILY AS NEEDED (ANXIETY, PANIC ATTACKS, SLEEP). 180 tablet 0   sertraline  (ZOLOFT ) 25 MG tablet Take 1 tablet (25 mg total) by mouth at bedtime. 90 tablet 0   No facility-administered medications prior to visit.    Allergies[1]  Review of Systems  As per HPI  PE:    03/06/2024    8:57 AM 03/06/2024    8:52 AM 12/26/2023    8:08 AM  Vitals with BMI  Height  5' 11.5 5' 11.5  Weight  219 lbs 13 oz 215 lbs  BMI  30.23 29.57  Systolic 114 131 889  Diastolic 75 90 75  Pulse  68 65     Physical Exam  Gen: Alert, well appearing.  Patient is oriented to person, place, time, and situation. AFFECT: pleasant, lucid thought and speech. No further exam today  LABS:  Last CBC Lab Results  Component Value Date   WBC 4.6 09/19/2023   HGB 15.4 09/19/2023   HCT 45.8 09/19/2023   MCV 89.1  09/19/2023   MCH 30.2 07/22/2016   RDW 13.6 09/19/2023   PLT 237.0 09/19/2023   Last metabolic panel Lab Results  Component Value Date   GLUCOSE 111 (H) 12/26/2023   NA 142 12/26/2023   K 4.5 12/26/2023   CL 103 12/26/2023   CO2 32 12/26/2023   BUN 13 12/26/2023   CREATININE 0.88 12/26/2023   GFR 102.87 12/26/2023   CALCIUM 9.4 12/26/2023   PROT 6.9 09/19/2023   ALBUMIN 4.5 09/19/2023   BILITOT 0.7 09/19/2023   ALKPHOS 43 09/19/2023   AST 25 09/19/2023   ALT 44 09/19/2023   ANIONGAP 8 07/22/2016   Last lipids Lab Results  Component Value Date   CHOL 148 09/19/2023   HDL 45.90 09/19/2023   LDLCALC 90 09/19/2023   TRIG 62.0 09/19/2023   CHOLHDL 3 09/19/2023   Last hemoglobin A1c Lab Results  Component Value Date   HGBA1C 5.9 12/26/2023   Last thyroid  functions Lab Results  Component Value Date   TSH 1.55 09/19/2023   IMPRESSION AND PLAN:  #1 diabetes without complication. He now meets the criteria for diabetes based on his fasting glucoses. He is interested in starting Wegovy  pills. Prescribed Wegovy  1.5 mg tabs, 1 tab daily.  We will continue this dose for the next month and reevaluate and increase to next dose as appropriate. Therapeutic expectations and side effect profile of medication discussed today.  Patient's questions answered.  Next A1c in 1 month.  #2 GAD with increased anxiety periodically related to airplane/travel. Refilled lorazepam  0.5 mg, 1-2 twice daily as needed, #90, refill x 1.  An After Visit Summary was printed and given to the patient.  FOLLOW UP: No follow-ups on file.  Signed:  Gerlene Hockey, MD           03/06/2024     [1] No Known Allergies

## 2024-03-18 NOTE — Progress Notes (Unsigned)
 [facility] [address] [place] [address] Dept: [phone] Dept Fax: [phone]  Psychotherapy Progress Note  Patient ID: Joshua Page, male  DOB: 1975-02-19, 49 y.o.  MRN: [id number]  03/19/2024 Start time: 8:00am End time: 8:50am  Method of Visit: Video: Virtual Visit via Video Encounter:  I connected with Joshua Page on 03/19/24 at  8:00 AM EST by a video enabled telemedicine application and verified that I am speaking with the correct person using two identifiers.  Location: Patient: [place] Provider: [place]   I discussed the limitations of evaluation and management by telemedicine and the availability of in person appointments. The patient expressed understanding and agreed to proceed.  I discussed the assessment and treatment plan with the patient. The patient was provided an opportunity to ask questions and all were answered. The patient agreed with the plan and demonstrated an understanding of the instructions.   The patient was advised to call back or seek an in-person evaluation if the symptoms worsen or if the condition fails to improve as anticipated.  Present: patient  Current Concerns: anxiety  Current Symptoms: Anxiety  Psychiatric Specialty Exam: General Appearance: Casual  Eye Contact:  Good  Speech:  Clear and Coherent  Volume:  Normal  Mood:  Euthymic  Affect:  Appropriate  Thought Process:  Coherent  Orientation:  Full (Time, Place, and Person)  Thought Content:  Logical  Suicidal Thoughts:  No  Homicidal Thoughts:  No  Memory:  Immediate;   Fair  Judgement:  Fair  Insight:  Good  Psychomotor Activity:  Normal  Concentration:  Concentration: Fair  Recall:  [employer] of Knowledge:Fair  Language: Fair  Akathisia:  No  Handed:  Unknown  AIMS (if indicated):  not done  Assets:  Communication Skills Desire for Improvement Financial  Resources/Insurance Housing Intimacy Leisure Time Physical Health Resilience Social Support Talents/Skills Transportation Vocational/Educational  ADL's:  Intact  Cognition: WNL  Sleep:  Fair    Diagnosis: Generalized anxiety disorder with panic attacks, History of alcohol and nicotine abuse    Anticipated Frequency of Visits: every other week  Anticipated Length of Treatment Episode: 12 months   Short Term Goals/Goals for Treatment Session: work on uncovering patient's past and his emotions associated with this  Progress Towards Goals: Progressing as evidenced by patient insight into need to establish boundaries with his mom and patient decreased anxiety. Not progressing as evidenced by patient still not talking with mom.   Today, we continued to discuss patient's relationship with his mom.  Continued to explore patient's frustration in his relationship with his mom and currently not talking with her as much as he used to  We discussed patient's expectation of his relationship with his mom which was his desire to have a cordial relationship Patient discussed previous frustration with mom crossing boundaries by making inconsiderate comments re his family as well as his mom's difficulty with financial stability. Patient currently feels that he has a wall up with his mom.   However, patient feels obligation to support his mom and is anticipating having to support her in the future.  He has difficulty accepting that his mom has continued difficulties with financial stability  We discussed ways that she supported him during childhood, he acknowledges that she was present at his sporting events and school functions.  When asked about ways for patient to accept her as an adult, patient had more difficulty identifying ways that she supported him Patient's  current boundary with mom is to talk with her during the holidays.  Encouraged patient to continue to consider what types of boundaries he  wants to have with his mom, if this current boundary is enough or too much, as well as what he can accept from her and what he cannot accept from her.   Treatment Intervention: Other: Psychodynamic psychotherapy   Medical Necessity: Improved patient condition  Assessment Tools:    03/06/2024    8:56 AM 12/26/2023    8:10 AM 09/19/2023    8:55 AM  Depression screen PHQ 2/9  Decreased Interest 0 0 0  Down, Depressed, Hopeless 0 0 0  PHQ - 2 Score 0 0 0  Altered sleeping 0 1   Tired, decreased energy 0 1   Change in appetite 0 1   Feeling bad or failure about yourself  0 0   Trouble concentrating 0 0   Moving slowly or fidgety/restless 0 1   Suicidal thoughts 0 0   PHQ-9 Score 0 4   Difficult doing work/chores Not difficult at all Not difficult at all    Failed to redirect to the Timeline version of the REVFS SmartLink.   Collaboration of Care: Other psychiatrist  Patient/Guardian was advised Release of Information must be obtained prior to any record release in order to collaborate their care with an outside provider. Patient/Guardian was advised if they have not already done so to contact the registration department to sign all necessary forms in order for us  to release information regarding their care.   Consent: Patient/Guardian gives verbal consent for treatment and assignment of benefits for services provided during this visit. Patient/Guardian expressed understanding and agreed to proceed.   Plan:  -patient to identify the obligations that he feels towards his mom as well as his own boundaries and amount of contact that he wants to have with his mom -increase patient insight into his avoidance behaviors including avoidance of being upfront with his own emotions  Joshua Minor, MD, PGY-3 03/19/2024

## 2024-03-19 ENCOUNTER — Ambulatory Visit (HOSPITAL_COMMUNITY): Admitting: Psychiatry

## 2024-03-19 DIAGNOSIS — F411 Generalized anxiety disorder: Secondary | ICD-10-CM | POA: Diagnosis not present

## 2024-03-19 DIAGNOSIS — F410 Panic disorder [episodic paroxysmal anxiety] without agoraphobia: Secondary | ICD-10-CM | POA: Diagnosis not present

## 2024-03-19 NOTE — Progress Notes (Signed)
 Erroneous encounter - this appointment was cancelled.

## 2024-03-22 NOTE — Progress Notes (Unsigned)
 [facility] [address] [place] [address] Dept: [phone] Dept Fax: [phone]  Psychotherapy Progress Note  Patient ID: Joshua Page, male  DOB: [birth year], 49 y.o.  MRN: [id number]  03/22/2024 Start time: 8:00am End time: 8:50am  Method of Visit: Video: Virtual Visit via Video Encounter:  I connected with Joshua Page on 03/22/24 at  8:00 AM EST by a video enabled telemedicine application and verified that I am speaking with the correct person using two identifiers.  Location: Patient: Joshua Page Provider: [place]   I discussed the limitations of evaluation and management by telemedicine and the availability of in person appointments. The patient expressed understanding and agreed to proceed.  I discussed the assessment and treatment plan with the patient. The patient was provided an opportunity to ask questions and all were answered. The patient agreed with the plan and demonstrated an understanding of the instructions.   The patient was advised to call back or seek an in-person evaluation if the symptoms worsen or if the condition fails to improve as anticipated.  Present: patient  Current Concerns: anxiety  Current Symptoms: Anxiety  Psychiatric Specialty Exam: General Appearance: Casual  Eye Contact:  Good  Speech:  Clear and Coherent  Volume:  Normal  Mood:  Euthymic  Affect:  Appropriate  Thought Process:  Coherent  Orientation:  Full (Time, Place, and Person)  Thought Content:  Logical  Suicidal Thoughts:  No  Homicidal Thoughts:  No  Memory:  Immediate;   Fair  Judgement:  Fair  Insight:  Good  Psychomotor Activity:  Normal  Concentration:  Concentration: Fair  Recall:  [employer] of Knowledge:Fair  Language: Fair  Akathisia:  No  Handed:  Unknown  AIMS (if indicated):  not done  Assets:  Communication Skills Desire for Improvement Financial  Resources/Insurance Housing Intimacy Leisure Time Physical Health Resilience Social Support Talents/Skills Transportation Vocational/Educational  ADL's:  Intact  Cognition: WNL  Sleep:  Fair    Diagnosis: Generalized anxiety disorder with panic attacks, History of alcohol and nicotine abuse    Anticipated Frequency of Visits: every other week  Anticipated Length of Treatment Episode: 12 months   Short Term Goals/Goals for Treatment Session: work on uncovering patient's past and his emotions associated with this  Progress Towards Goals: Progressing as evidenced by patient insight into need to establish boundaries with his mom and patient decreased anxiety. Not progressing as evidenced by patient still not talking with mom.   Today, we continued to discuss patient's relationship with his mom.  Continued to explore patient's frustration in his relationship with his mom and currently not talking with her as much as he used to  We discussed patient's expectation of his relationship with his mom which was his desire to have a cordial relationship Patient discussed previous frustration with mom crossing boundaries by making inconsiderate comments re his family as well as his mom's difficulty with financial stability. Patient currently feels that he has a wall up with his mom.   However, patient feels obligation to support his mom and is anticipating having to support her in the future.  He has difficulty accepting that his mom has continued difficulties with financial stability  We discussed ways that she supported him during childhood, he acknowledges that she was present at his sporting events and school functions.  When asked about ways for patient to accept her as an adult, patient had more difficulty identifying ways that she supported him Patient's  current boundary with mom is to talk with her during the holidays.  Encouraged patient to continue to consider what types of boundaries he  wants to have with his mom, if this current boundary is enough or too much, as well as what he can accept from her and what he cannot accept from her.   Treatment Intervention: Other: Psychodynamic psychotherapy   Medical Necessity: Improved patient condition  Assessment Tools:    03/06/2024    8:56 AM 12/26/2023    8:10 AM 09/19/2023    8:55 AM  Depression screen PHQ 2/9  Decreased Interest 0 0 0  Down, Depressed, Hopeless 0 0 0  PHQ - 2 Score 0 0 0  Altered sleeping 0 1   Tired, decreased energy 0 1   Change in appetite 0 1   Feeling bad or failure about yourself  0 0   Trouble concentrating 0 0   Moving slowly or fidgety/restless 0 1   Suicidal thoughts 0 0   PHQ-9 Score 0 4   Difficult doing work/chores Not difficult at all Not difficult at all    Failed to redirect to the Timeline version of the REVFS SmartLink.   Collaboration of Care: Other psychiatrist  Patient/Guardian was advised Release of Information must be obtained prior to any record release in order to collaborate their care with an outside provider. Patient/Guardian was advised if they have not already done so to contact the registration department to sign all necessary forms in order for us  to release information regarding their care.   Consent: Patient/Guardian gives verbal consent for treatment and assignment of benefits for services provided during this visit. Patient/Guardian expressed understanding and agreed to proceed.   Plan:  -patient to identify the obligations that he feels towards his mom as well as his own boundaries and amount of contact that he wants to have with his mom -increase patient insight into his avoidance behaviors including avoidance of being upfront with his own emotions  Corean Minor, MD, PGY-3 03/22/2024

## 2024-04-02 ENCOUNTER — Ambulatory Visit (HOSPITAL_COMMUNITY): Admitting: Psychiatry

## 2024-04-03 ENCOUNTER — Ambulatory Visit: Admitting: Family Medicine

## 2024-04-09 ENCOUNTER — Ambulatory Visit (HOSPITAL_COMMUNITY): Admitting: Psychiatry

## 2024-06-25 ENCOUNTER — Ambulatory Visit: Admitting: Family Medicine
